# Patient Record
Sex: Female | Born: 1995 | Race: White | Hispanic: No | Marital: Single | Smoking: Never smoker
Health system: Southern US, Community
[De-identification: ages and names within clinical notes are randomized; demographics above are authoritative.]

## PROBLEM LIST (undated history)

## (undated) DIAGNOSIS — L709 Acne, unspecified: Secondary | ICD-10-CM

## (undated) DIAGNOSIS — H9325 Central auditory processing disorder: Secondary | ICD-10-CM

## (undated) DIAGNOSIS — N920 Excessive and frequent menstruation with regular cycle: Secondary | ICD-10-CM

## (undated) DIAGNOSIS — F319 Bipolar disorder, unspecified: Secondary | ICD-10-CM

## (undated) HISTORY — DX: Central auditory processing disorder: H93.25

## (undated) HISTORY — DX: Acne, unspecified: L70.9

## (undated) HISTORY — PX: DENTAL SURGERY: SHX609

## (undated) HISTORY — PX: TYMPANOSTOMY: SHX2586

## (undated) HISTORY — DX: Excessive and frequent menstruation with regular cycle: N92.0

---
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
## 2014-01-09 ENCOUNTER — Other Ambulatory Visit: Payer: Self-pay | Admitting: Nurse Practitioner

## 2014-01-11 NOTE — Telephone Encounter (Signed)
Patients last AEX was 08/26/12 when she was started on OCP, then next appointment was 02/16/13 to follow up on OCP. Left message for patient to schedule AEX//kn

## 2014-01-15 ENCOUNTER — Telehealth: Payer: Self-pay | Admitting: Nurse Practitioner

## 2014-01-15 NOTE — Telephone Encounter (Signed)
Message left to return call to [facility] at [phone].   Patient needs to schedule annual.   Tresa [facility]Kelly placed new order for ocp. Will discuss with patient at return call.

## 2014-01-15 NOTE — Telephone Encounter (Signed)
Patient returned call and aex scheduled.   Routing to provider for final review. Patient agreeable to disposition. Will close encounter

## 2014-01-15 NOTE — Telephone Encounter (Signed)
[facility] is calling [facility].? i do not see a note in here anywhere.

## 2014-01-15 NOTE — Telephone Encounter (Signed)
agree

## 2014-02-23 ENCOUNTER — Encounter: Payer: Self-pay | Admitting: Nurse Practitioner

## 2014-02-23 ENCOUNTER — Ambulatory Visit (INDEPENDENT_AMBULATORY_CARE_PROVIDER_SITE_OTHER): Payer: BC Managed Care – PPO | Admitting: Nurse Practitioner

## 2014-02-23 VITALS — BP 120/82 | HR 80 | Ht 67.5 in | Wt 181.0 lb

## 2014-02-23 DIAGNOSIS — Z0000 Encounter for general adult medical examination without abnormal findings: Secondary | ICD-10-CM

## 2014-02-23 DIAGNOSIS — Z01419 Encounter for gynecological examination (general) (routine) without abnormal findings: Secondary | ICD-10-CM

## 2014-02-23 LAB — POCT URINALYSIS DIPSTICK
BILIRUBIN UA: NEGATIVE
GLUCOSE UA: NEGATIVE
Ketones, UA: NEGATIVE
LEUKOCYTES UA: NEGATIVE
NITRITE UA: NEGATIVE
Protein, UA: NEGATIVE
RBC UA: NEGATIVE
UROBILINOGEN UA: NEGATIVE
pH, UA: 7

## 2014-02-23 LAB — HEMOGLOBIN, FINGERSTICK: HEMOGLOBIN, FINGERSTICK: 14.4 g/dL (ref 12.0–16.0)

## 2014-02-23 MED ORDER — DESOGESTREL-ETHINYL ESTRADIOL 0.15-0.02/0.01 MG (21/5) PO TABS: ORAL_TABLET | ORAL | Status: DC

## 2014-02-23 NOTE — Patient Instructions (Signed)
General topics  Next pap or exam is  due in 1 year Take a Women's multivitamin Take 1200 mg. of calcium daily - prefer dietary If any concerns in interim to call back  Breast Self-Awareness Practicing breast self-awareness may pick up problems early, prevent significant medical complications, and possibly save your life. By practicing breast self-awareness, you can become familiar with how your breasts look and feel and if your breasts are changing. This allows you to notice changes early. It can also offer you some reassurance that your breast health is good. One way to learn what is normal for your breasts and whether your breasts are changing is to do a breast self-exam. If you find a lump or something that was not present in the past, it is best to contact your caregiver right away. Other findings that should be evaluated by your caregiver include nipple discharge, especially if it is bloody; skin changes or reddening; areas where the skin seems to be pulled in (retracted); or new lumps and bumps. Breast pain is seldom associated with cancer (malignancy), but should also be evaluated by a caregiver. BREAST SELF-EXAM The best time to examine your breasts is 5 7 days after your menstrual period is over.  ExitCare Patient Information 2013 ExitCare, LLC.   Exercise to Stay Healthy Exercise helps you become and stay healthy. EXERCISE IDEAS AND TIPS Choose exercises that:  You enjoy.  Fit into your day. You do not need to exercise really hard to be healthy. You can do exercises at a slow or medium level and stay healthy. You can:  Stretch before and after working out.  Try yoga, Pilates, or tai chi.  Lift weights.  Walk fast, swim, jog, run, climb stairs, bicycle, dance, or rollerskate.  Take aerobic classes. Exercises that burn about 150 calories:  Running 1  miles in 15 minutes.  Playing volleyball for 45 to 60 minutes.  Washing and waxing a car for 45 to 60  minutes.  Playing touch football for 45 minutes.  Walking 1  miles in 35 minutes.  Pushing a stroller 1  miles in 30 minutes.  Playing basketball for 30 minutes.  Raking leaves for 30 minutes.  Bicycling 5 miles in 30 minutes.  Walking 2 miles in 30 minutes.  Dancing for 30 minutes.  Shoveling snow for 15 minutes.  Swimming laps for 20 minutes.  Walking up stairs for 15 minutes.  Bicycling 4 miles in 15 minutes.  Gardening for 30 to 45 minutes.  Jumping rope for 15 minutes.  Washing windows or floors for 45 to 60 minutes. Document Released: 11/24/2010 Document Revised: 01/14/2012 Document Reviewed: 11/24/2010 ExitCare Patient Information 2013 ExitCare[employer] LLC.   Other topics ( that may be useful information):    Sexually Transmitted Disease Sexually transmitted disease (STD) refers to any infection that is passed from person to person during sexual activity. This may happen by way of saliva, semen, blood, vaginal mucus, or urine. Common STDs include:  Gonorrhea.  Chlamydia.  Syphilis.  HIV/AIDS.  Genital herpes.  Hepatitis B and C.  Trichomonas.  Human papillomavirus (HPV).  Pubic lice. CAUSES  An STD may be spread by bacteria, virus, or parasite. A person can get an STD by:  Sexual intercourse with an infected person.  Sharing sex toys with an infected person.  Sharing needles with an infected person.  Having intimate contact with the genitals, mouth, or rectal areas of an infected person. SYMPTOMS  Some people may not have any symptoms, but   they can still pass the infection to others. Different STDs have different symptoms. Symptoms include:  Painful or bloody urination.  Pain in the pelvis, abdomen, vagina, anus, throat, or eyes.  Skin rash, itching, irritation, growths, or sores (lesions). These usually occur in the genital or anal area.  Abnormal vaginal discharge.  Penile discharge in men.  Soft, flesh-colored skin growths in the  genital or anal area.  Fever.  Pain or bleeding during sexual intercourse.  Swollen glands in the groin area.  Yellow skin and eyes (jaundice). This is seen with hepatitis. DIAGNOSIS  To make a diagnosis, your caregiver may:  Take a medical history.  Perform a physical exam.  Take a specimen (culture) to be examined.  Examine a sample of discharge under a microscope.  Perform blood test TREATMENT   Chlamydia, gonorrhea, trichomonas, and syphilis can be cured with antibiotic medicine.  Genital herpes, hepatitis, and HIV can be treated, but not cured, with prescribed medicines. The medicines will lessen the symptoms.  Genital warts from HPV can be treated with medicine or by freezing, burning (electrocautery), or surgery. Warts may come back.  HPV is a virus and cannot be cured with medicine or surgery.However, abnormal areas may be followed very closely by your caregiver and may be removed from the cervix, vagina, or vulva through office procedures or surgery. If your diagnosis is confirmed, your recent sexual partners need treatment. This is true even if they are symptom-free or have a negative culture or evaluation. They should not have sex until their caregiver says it is okay. HOME CARE INSTRUCTIONS  All sexual partners should be informed, tested, and treated for all STDs.  Take your antibiotics as directed. Finish them even if you start to feel better.  Only take over-the-counter or prescription medicines for pain, discomfort, or fever as directed by your caregiver.  Rest.  Eat a balanced diet and drink enough fluids to keep your urine clear or pale yellow.  Do not have sex until treatment is completed and you have followed up with your caregiver. STDs should be checked after treatment.  Keep all follow-up appointments, Pap tests, and blood tests as directed by your caregiver.  Only use latex condoms and water-soluble lubricants during sexual activity. Do not use  petroleum jelly or oils.  Avoid alcohol and illegal drugs.  Get vaccinated for HPV and hepatitis. If you have not received these vaccines in the past, talk to your caregiver about whether one or both might be right for you.  Avoid risky sex practices that can break the skin. The only way to avoid getting an STD is to avoid all sexual activity.Latex condoms and dental dams (for oral sex) will help lessen the risk of getting an STD, but will not completely eliminate the risk. SEEK MEDICAL CARE IF:   You have a fever.  You have any new or worsening symptoms. Document Released: 01/12/2003 Document Revised: 01/14/2012 Document Reviewed: 01/19/2011 [facility] Patient Information 2013 [employer].    Domestic Abuse You are being battered or abused if someone close to you hits, pushes, or physically hurts you in any way. You also are being abused if you are forced into activities. You are being sexually abused if you are forced to have sexual contact of any kind. You are being emotionally abused if you are made to feel worthless or if you are constantly threatened. It is important to remember that help is available. No one has the right to abuse you. PREVENTION OF FURTHER  ABUSE  Learn the warning signs of danger. This varies with situations but may include: the use of alcohol, threats, isolation from friends and family, or forced sexual contact. Leave if you feel that violence is going to occur.  If you are attacked or beaten, report it to the police so the abuse is documented. You do not have to press charges. The police can protect you while you or the attackers are leaving. Get the officer's name and badge number and a copy of the report.  Find someone you can trust and tell them what is happening to you: your caregiver, a nurse, clergy member, close friend or family member. Feeling ashamed is natural, but remember that you have done nothing wrong. No one deserves abuse. Document Released:  10/19/2000 Document Revised: 01/14/2012 Document Reviewed: 12/28/2010 ExitCare Patient Information 2013 ExitCare[employer] LLC.    How Much is Too Much Alcohol? Drinking too much alcohol can cause injury, accidents, and health problems. These types of problems can include:   Car crashes.  Falls.  Family fighting (domestic violence).  Drowning.  Fights.  Injuries.  Burns.  Damage to certain organs.  Having a baby with birth defects. ONE DRINK CAN BE TOO MUCH WHEN YOU ARE:  Working.  Pregnant or breastfeeding.  Taking medicines. Ask your doctor.  Driving or planning to drive. If you or someone you know has a drinking problem, get help from a doctor.  Document Released: 08/18/2009 Document Revised: 01/14/2012 Document Reviewed: 08/18/2009 ExitCare Patient Information 2013 ExitCare[employer] LLC.   Smoking Hazards Smoking cigarettes is extremely bad for your health. Tobacco smoke has over 200 known poisons in it. There are over 60 chemicals in tobacco smoke that cause cancer. Some of the chemicals found in cigarette smoke include:   Cyanide.  Benzene.  Formaldehyde.  Methanol (wood alcohol).  Acetylene (fuel used in welding torches).  Ammonia. Cigarette smoke also contains the poisonous gases nitrogen oxide and carbon monoxide.  Cigarette smokers have an increased risk of many serious medical problems and Smoking causes approximately:  90% of all lung cancer deaths in men.  80% of all lung cancer deaths in women.  90% of deaths from chronic obstructive lung disease. Compared with nonsmokers, smoking increases the risk of:  Coronary heart disease by 2 to 4 times.  Stroke by 2 to 4 times.  Men developing lung cancer by 23 times.  Women developing lung cancer by 13 times.  Dying from chronic obstructive lung diseases by 12 times.  . Smoking is the most preventable cause of death and disease in our society.  WHY IS SMOKING ADDICTIVE?  Nicotine is the chemical  agent in tobacco that is capable of causing addiction or dependence.  When you smoke and inhale, nicotine is absorbed rapidly into the bloodstream through your lungs. Nicotine absorbed through the lungs is capable of creating a powerful addiction. Both inhaled and non-inhaled nicotine may be addictive.  Addiction studies of cigarettes and spit tobacco show that addiction to nicotine occurs mainly during the teen years, when young people begin using tobacco products. WHAT ARE THE BENEFITS OF QUITTING?  There are many health benefits to quitting smoking.   Likelihood of developing cancer and heart disease decreases. Health improvements are seen almost immediately.  Blood pressure, pulse rate, and breathing patterns start returning to normal soon after quitting. QUITTING SMOKING   American Lung Association - 1-800-LUNGUSA  American Cancer Society - 1-800-ACS-2345 Document Released: 11/29/2004 Document Revised: 01/14/2012 Document Reviewed: 08/03/2009 ExitCare Patient Information 2013 ExitCare,   LLC.   Stress Management Stress is a state of physical or mental tension that often results from changes in your life or normal routine. Some common causes of stress are:  Death of a loved one.  Injuries or severe illnesses.  Getting fired or changing jobs.  Moving into a new home. Other causes may be:  Sexual problems.  Business or financial losses.  Taking on a large debt.  Regular conflict with someone at home or at work.  Constant tiredness from lack of sleep. It is not just bad things that are stressful. It may be stressful to:  Win the lottery.  Get married.  Buy a new car. The amount of stress that can be easily tolerated varies from person to person. Changes generally cause stress, regardless of the types of change. Too much stress can affect your health. It may lead to physical or emotional problems. Too little stress (boredom) may also become stressful. SUGGESTIONS TO  REDUCE STRESS:  Talk things over with your family and friends. It often is helpful to share your concerns and worries. If you feel your problem is serious, you may want to get help from a professional counselor.  Consider your problems one at a time instead of lumping them all together. Trying to take care of everything at once may seem impossible. List all the things you need to do and then start with the most important one. Set a goal to accomplish 2 or 3 things each day. If you expect to do too many in a single day you will naturally fail, causing you to feel even more stressed.  Do not use alcohol or drugs to relieve stress. Although you may feel better for a short time, they do not remove the problems that caused the stress. They can also be habit forming.  Exercise regularly - at least 3 times per week. Physical exercise can help to relieve that "uptight" feeling and will relax you.  The shortest distance between despair and hope is often a good night's sleep.  Go to bed and get up on time allowing yourself time for appointments without being rushed.  Take a short "time-out" period from any stressful situation that occurs during the day. Close your eyes and take some deep breaths. Starting with the muscles in your face, tense them, hold it for a few seconds, then relax. Repeat this with the muscles in your neck, shoulders, hand, stomach, back and legs.  Take good care of yourself. Eat a balanced diet and get plenty of rest.  Schedule time for having fun. Take a break from your daily routine to relax. HOME CARE INSTRUCTIONS   Call if you feel overwhelmed by your problems and feel you can no longer manage them on your own.  Return immediately if you feel like hurting yourself or someone else. Document Released: 04/17/2001 Document Revised: 01/14/2012 Document Reviewed: 12/08/2007 ExitCare Patient Information 2013 ExitCare, LLC.  

## 2014-02-23 NOTE — Progress Notes (Signed)
Patient ID: Renee GleeSarah E Mcclain, female   DOB: 03/16/1996, 18 y.o.   MRN: [id number] 18 y.o. G0P0 Single Caucasian Fe here for annual exam.  Menses is usually 4 days and heavier for 1 day.  1 day of cramps that are tolerable.  Same partner for 2 years. She is attending [employer] for business degree.   Patient's last menstrual period was 02/10/2014.          Sexually active: yes  The current method of family planning is OCP (estrogen/progesterone).    Exercising: no  The patient does not participate in regular exercise at present. Smoker:  no  Health Maintenance: Pap:  N/A TDaP:  UTD Gardasil:  completed at age 18 Labs:  HB:  Urine:    reports that she has never smoked. She has never used smokeless tobacco. She reports that she does not drink alcohol or use illicit drugs.  Past Medical History  Diagnosis Date  . Asthma     "grew out of it around 18 years of age"  . Acne   . Menorrhagia   . Attention deficit hyperactivity disorder (ADHD)     Past Surgical History  Procedure Laterality Date  . Tympanostomy    . Dental surgery      Current Outpatient Prescriptions  Medication Sig Dispense Refill  . AZURETTE 0.15-0.02/0.01 MG (21/5) tablet TAKE 1 TABLET BY MOUTH ONCE DAILY  84 tablet  0  . UNKNOWN TO PATIENT Antidepressant, unknown name.       No current facility-administered medications for this visit.    Family History  Problem Relation Age of Onset  . Heart disease Maternal Grandfather   . Hypertension Paternal Grandfather   . Cancer Maternal Grandmother     pancreatic    ROS:  Pertinent items are noted in HPI.  Otherwise, a comprehensive ROS was negative.  Exam:   BP 120/82  Pulse 80  Ht 5' 7.5" (1.715 m)  Wt 181 lb (82.101 kg)  BMI 27.91 kg/m2  LMP 02/10/2014 Height: 5' 7.5" (171.5 cm)  Ht Readings from Last 3 Encounters:  02/23/14 5' 7.5" (1.715 m) (90%*, Z = 1.27)  02/16/13 5' 7.75" (1.721 m) (92%*, Z = 1.39)   * Growth percentiles are based on CDC 2-20 Years  data.    General appearance: alert, cooperative and appears stated age Head: Normocephalic, without obvious abnormality, atraumatic Neck: no adenopathy, supple, symmetrical, trachea midline and thyroid normal to inspection and palpation Lungs: clear to auscultation bilaterally Breasts: normal appearance, no masses or tenderness Heart: regular rate and rhythm Abdomen: soft, non-tender; no masses,  no organomegaly Extremities: extremities normal, atraumatic, no cyanosis or edema Skin: Skin color, texture, turgor normal. No rashes or lesions Lymph nodes: Cervical, supraclavicular, and axillary nodes normal. No abnormal inguinal nodes palpated Neurologic: Grossly normal   Pelvic: External genitalia:  no lesions              Urethra:  normal appearing urethra with no masses, tenderness or lesions              Bartholin's and Skene's: normal                 Vagina: normal appearing vagina with normal color and discharge, no lesions              Cervix: anteverted              Pap taken: no Bimanual Exam:  Uterus:  normal size, contour, position, consistency, mobility, non-tender  Adnexa: no mass, fullness, tenderness               Rectovaginal: Confirms               Anus:  normal sphincter tone, no lesions  A:  Well Woman with normal exam  Contraception   P:   Reviewed health and wellness pertinent to exam  Refill on OCP Mircette for a year  She is advised to take OTC NSAID's for the first day of cycle to help with dysmenorrhea.  Declines need for STD's  Counseled on breast self exam, STD prevention, HIV risk factors and prevention, use and side effects of OCP's, adequate intake of calcium and vitamin D, diet and exercise return annually or prn  An After Visit Summary was printed and given to the patient.

## 2014-02-27 NOTE — Progress Notes (Signed)
Encounter reviewed by Dr. Youssouf Shipley Silva.  

## 2014-04-09 ENCOUNTER — Other Ambulatory Visit: Payer: Self-pay | Admitting: Nurse Practitioner

## 2014-04-12 NOTE — Telephone Encounter (Signed)
Last AEX and refill 02/23/14 #3 packs/3 refills No future appt  Rx sent for 28 days with note- Patient will need AEX for further refills.

## 2014-04-21 ENCOUNTER — Encounter (HOSPITAL_COMMUNITY): Payer: Self-pay | Admitting: Emergency Medicine

## 2014-04-21 ENCOUNTER — Emergency Department (HOSPITAL_COMMUNITY)
Admission: EM | Admit: 2014-04-21 | Discharge: 2014-04-21 | Disposition: A | Discharge status: Home / Self Care | Payer: BC Managed Care – PPO | Attending: Emergency Medicine | Admitting: Emergency Medicine

## 2014-04-21 DIAGNOSIS — R45851 Suicidal ideations: Secondary | ICD-10-CM | POA: Insufficient documentation

## 2014-04-21 DIAGNOSIS — Z79899 Other long term (current) drug therapy: Secondary | ICD-10-CM | POA: Insufficient documentation

## 2014-04-21 DIAGNOSIS — F4321 Adjustment disorder with depressed mood: Secondary | ICD-10-CM

## 2014-04-21 DIAGNOSIS — Z872 Personal history of diseases of the skin and subcutaneous tissue: Secondary | ICD-10-CM | POA: Insufficient documentation

## 2014-04-21 DIAGNOSIS — F39 Unspecified mood [affective] disorder: Secondary | ICD-10-CM | POA: Insufficient documentation

## 2014-04-21 DIAGNOSIS — Z8742 Personal history of other diseases of the female genital tract: Secondary | ICD-10-CM | POA: Insufficient documentation

## 2014-04-21 DIAGNOSIS — J45909 Unspecified asthma, uncomplicated: Secondary | ICD-10-CM | POA: Insufficient documentation

## 2014-04-21 LAB — RAPID URINE DRUG SCREEN, HOSP PERFORMED
AMPHETAMINES: NOT DETECTED
BENZODIAZEPINES: NOT DETECTED
Barbiturates: NOT DETECTED
COCAINE: NOT DETECTED
Opiates: NOT DETECTED
Tetrahydrocannabinol: POSITIVE — AB

## 2014-04-21 LAB — CBC
HEMATOCRIT: 40.3 % (ref 36.0–46.0)
Hemoglobin: 13.7 g/dL (ref 12.0–15.0)
MCH: 30.2 pg (ref 26.0–34.0)
MCHC: 34 g/dL (ref 30.0–36.0)
MCV: 88.8 fL (ref 78.0–100.0)
PLATELETS: 291 10*3/uL (ref 150–400)
RBC: 4.54 MIL/uL (ref 3.87–5.11)
RDW: 12.4 % (ref 11.5–15.5)
WBC: 10.4 10*3/uL (ref 4.0–10.5)

## 2014-04-21 LAB — COMPREHENSIVE METABOLIC PANEL
ALBUMIN: 3.9 g/dL (ref 3.5–5.2)
ALK PHOS: 43 U/L (ref 39–117)
ALT: 13 U/L (ref 0–35)
AST: 14 U/L (ref 0–37)
BUN: 6 mg/dL (ref 6–23)
CO2: 20 mEq/L (ref 19–32)
CREATININE: 0.63 mg/dL (ref 0.50–1.10)
Calcium: 9 mg/dL (ref 8.4–10.5)
Chloride: 105 mEq/L (ref 96–112)
GFR calc Af Amer: >90 mL/min (ref >90)
GFR calc non Af Amer: >90 mL/min (ref >90)
Glucose, Bld: 93 mg/dL (ref 70–99)
POTASSIUM: 3.8 meq/L (ref 3.7–5.3)
Sodium: 140 mEq/L (ref 137–147)
TOTAL PROTEIN: 7.4 g/dL (ref 6.0–8.3)
Total Bilirubin: 0.3 mg/dL (ref 0.3–1.2)

## 2014-04-21 LAB — ETHANOL

## 2014-04-21 LAB — SALICYLATE LEVEL

## 2014-04-21 LAB — ACETAMINOPHEN LEVEL

## 2014-04-21 MED ORDER — IBUPROFEN 200 MG PO TABS: 600.0000 mg | ORAL_TABLET | Freq: Three times a day (TID) | ORAL | Status: DC | PRN

## 2014-04-21 MED ORDER — ALUM & MAG HYDROXIDE-SIMETH 200-200-20 MG/5ML PO SUSP: 30.0000 mL | ORAL | Status: DC | PRN

## 2014-04-21 MED ORDER — ZOLPIDEM TARTRATE 5 MG PO TABS: 5.0000 mg | ORAL_TABLET | Freq: Every evening | ORAL | Status: DC | PRN

## 2014-04-21 MED ORDER — LORAZEPAM 1 MG PO TABS: 1.0000 mg | ORAL_TABLET | Freq: Three times a day (TID) | ORAL | Status: DC | PRN

## 2014-04-21 MED ORDER — ACETAMINOPHEN 325 MG PO TABS: 650.0000 mg | ORAL_TABLET | ORAL | Status: DC | PRN

## 2014-04-21 MED ORDER — ONDANSETRON HCL 4 MG PO TABS: 4.0000 mg | ORAL_TABLET | Freq: Three times a day (TID) | ORAL | Status: DC | PRN

## 2014-04-21 NOTE — ED Notes (Signed)
Pt states that she has hx of depression.  Has been seeing counselor but now refuses to see her because she doesn's help.  States that she has had a "breakdown" before when her medicine was adjusted.  Was put on celexa about 2 months ago.  Got a DUI a few weeks ago and is feeling like things are out of control.  BIB mother who is concerned after pt made SI statements.  Pt endorses to this writer that she is suicidal with plan to drive her car into a tree.  States she has been smoking weed and taking xanax.  No HI.

## 2014-04-21 NOTE — BH Assessment (Signed)
Pt psychiatrically stable for discharge per psychiatry, Dr.Taylor and Shuvon Rankin. Pt [facility] at 9:00am and appt. With Renee KernMegan Blankmann,NP on 06-08-14 @1 :30pm. Pt able to contract for safety. Crisis resources and Outpatient appt's scheduled by TTS.  Renee PeachNajah Demetrias Goodbar, MS, LCASA Assessment Counselor

## 2014-04-21 NOTE — Discharge Instructions (Signed)
Adjustment Disorder °Most changes in life can cause stress. Getting used to changes may take a few months or longer. If feelings of stress, hopelessness, or worry continue, you may have an adjustment disorder. This stress-related mental health problem may affect your feelings, thinking and how you act. It occurs in both sexes and happens at any age. °SYMPTOMS  °Some of the following problems may be seen and vary from person to person: °· Sadness or depression. °· Loss of enjoyment. °· Thoughts of suicide. °· Fighting. °· Avoiding family and friends. °· Poor school performance. °· Hopelessness, sense of loss. °· Trouble sleeping. °· Vandalism. °· Worry, weight loss or gain. °· Crying spells. °· Anxiety °· Reckless driving. °· Skipping school. °· Poor work performance. °· Nervousness. °· Ignoring bills. °· Poor attitude. °DIAGNOSIS  °Your caregiver will ask what has happened in your life and do a physical exam. They will make a diagnosis of an adjustment disorder when they are sure another problem or medical illness causing your feelings does not exist. °TREATMENT  °When problems caused by stress interfere with you daily life or last longer than a few months, you may need counseling for an adjustment disorder. Early treatment may diminish problems and help you to better cope with the stressful events in your life. Sometimes medication is necessary. Individual counseling and or support groups can be very helpful. °PROGNOSIS  °Adjustment disorders usually last less than 3 to 6 months. The condition may persist if there is long lasting stress. This could include health problems, relationship problems, or job difficulties where you can not easily escape from what is causing the problem. °PREVENTION  °Even the most mentally healthy, highly functioning people can suffer from an adjustment disorder given a significant blow from a life-changing event. There is no way to prevent pain and loss. Most people need help from time  to time. You are not alone. °SEEK MEDICAL CARE IF:  °Your feelings or symptoms listed above do not improve or worsen. °Document Released: 06/26/2006 Document Revised: 01/14/2012 Document Reviewed: 09/17/2007 °ExitCare® Patient Information ©2015 ExitCare, LLC. This information is not intended to replace advice given to you by your health care provider. Make sure you discuss any questions you have with your health care provider. ° °

## 2014-04-21 NOTE — Consult Note (Signed)
Face to face evaluation and I agree with this note 

## 2014-04-21 NOTE — ED Notes (Signed)
Pt. Has one belonging bag that is at the nurse desk in triage and family will take belongings home when mother leaves.

## 2014-04-21 NOTE — ED Provider Notes (Signed)
Medical screening examination/treatment/procedure(s) were performed by non-physician practitioner and as supervising physician I was immediately available for consultation/collaboration.   EKG Interpretation None        Forrest S Harrison, MD 04/21/14 1613 

## 2014-04-21 NOTE — Consult Note (Signed)
[facility] Consult   Reason for Consult:  Suicidal ideation Referring Physician:  EDP  Renee Mcclain is an 18 y.o. female. Total Time spent with patient: 45 minutes  Assessment: AXIS I:  Adjustment Disorder with Depressed Mood AXIS II:  Deferred AXIS III:   Past Medical History  Diagnosis Date  . Asthma     "grew out of it around 18 years of age"  . Acne   . Menorrhagia   . Attention deficit hyperactivity disorder (ADHD)    AXIS IV:  other psychosocial or environmental problems AXIS V:  61-70 mild symptoms  Plan:  No evidence of imminent risk to self or others at present.   Patient does not meet criteria for psychiatric inpatient admission. Supportive therapy provided about ongoing stressors. Discussed crisis plan, support from social network, calling 911, coming to the Emergency Department, and calling Suicide Hotline.  Subjective:   Renee Mcclain is a 18 y.o. female patient.  HPI:  Patient states "I was upset this morning I have had a bad day and a bad week.  I just had a complete melt down and said some things that I shouldn't have said and didn't mean.  I just feel like I keep letting everyone down and I got a DUI and then me and my boyfriend got into a argument and I just felt like I let him down to.  I went to my parents office and told them I would run my car off of the road.  My Dad brought me here.   HPI Elements:   Location:  Depression. Quality:  recent DUI and argument with boyfriend. Severity:  adjustment disorder. Timing:  1 week.  Past Psychiatric History: Past Medical History  Diagnosis Date  . Asthma     "grew out of it around 18 years of age"  . Acne   . Menorrhagia   . Attention deficit hyperactivity disorder (ADHD)     reports that she has never smoked. She has never used smokeless tobacco. She reports that she uses illicit drugs (Marijuana). She reports that she does not drink alcohol. Family History  Problem Relation Age of  Onset  . Heart disease Maternal Grandfather   . Hypertension Paternal Grandfather   . Cancer Maternal Grandmother     pancreatic           Allergies:   Allergies  Allergen Reactions  . Azithromycin Hives    ACT Assessment Complete:  Yes:    Educational Status    Risk to Self: Risk to self Is patient at risk for suicide?: Yes Substance abuse history and/or treatment for substance abuse?: Yes  Risk to Others:    Abuse:    Prior Inpatient Therapy:    Prior Outpatient Therapy:    Additional Information:                    Objective: Blood pressure 154/81, pulse 90, temperature 97.7 F (36.5 C), temperature source Oral, resp. rate 16, last menstrual period 04/14/2014, SpO2 98.00%.There is no weight on file to calculate BMI. Results for orders placed during the hospital encounter of 04/21/14 (from the past 72 hour(s))  URINE RAPID DRUG SCREEN (HOSP PERFORMED)     Status: Abnormal   Collection Time    04/21/14 12:48 PM      Result Value Ref Range   Opiates NONE DETECTED  NONE DETECTED   Cocaine NONE DETECTED  NONE DETECTED   Benzodiazepines NONE DETECTED  NONE  DETECTED   Amphetamines NONE DETECTED  NONE DETECTED   Tetrahydrocannabinol POSITIVE (*) NONE DETECTED   Barbiturates NONE DETECTED  NONE DETECTED   Comment:            DRUG SCREEN FOR MEDICAL PURPOSES     ONLY.  IF CONFIRMATION IS NEEDED     FOR ANY PURPOSE, NOTIFY LAB     WITHIN 5 DAYS.                LOWEST DETECTABLE LIMITS     FOR URINE DRUG SCREEN     Drug Class       Cutoff (ng/mL)     Amphetamine      1000     Barbiturate      200     Benzodiazepine   354     Tricyclics       562     Opiates          300     Cocaine          300     THC              50  ACETAMINOPHEN LEVEL     Status: None   Collection Time    04/21/14  1:41 PM      Result Value Ref Range   Acetaminophen (Tylenol), Serum <15.0  10 - 30 ug/mL   Comment:            THERAPEUTIC CONCENTRATIONS VARY     SIGNIFICANTLY. A  RANGE OF 10-30     ug/mL MAY BE AN EFFECTIVE     CONCENTRATION FOR MANY PATIENTS.     HOWEVER, SOME ARE BEST TREATED     AT CONCENTRATIONS OUTSIDE THIS     RANGE.     ACETAMINOPHEN CONCENTRATIONS     >150 ug/mL AT 4 HOURS AFTER     INGESTION AND >50 ug/mL AT 12     HOURS AFTER INGESTION ARE     OFTEN ASSOCIATED WITH TOXIC     REACTIONS.  CBC     Status: None   Collection Time    04/21/14  1:41 PM      Result Value Ref Range   WBC 10.4  4.0 - 10.5 K/uL   RBC 4.54  3.87 - 5.11 MIL/uL   Hemoglobin 13.7  12.0 - 15.0 g/dL   HCT 40.3  36.0 - 46.0 %   MCV 88.8  78.0 - 100.0 fL   MCH 30.2  26.0 - 34.0 pg   MCHC 34.0  30.0 - 36.0 g/dL   RDW 12.4  11.5 - 15.5 %   Platelets 291  150 - 400 K/uL  COMPREHENSIVE METABOLIC PANEL     Status: None   Collection Time    04/21/14  1:41 PM      Result Value Ref Range   Sodium 140  137 - 147 mEq/L   Potassium 3.8  3.7 - 5.3 mEq/L   Chloride 105  96 - 112 mEq/L   CO2 20  19 - 32 mEq/L   Glucose, Bld 93  70 - 99 mg/dL   BUN 6  6 - 23 mg/dL   Creatinine, Ser 0.63  0.50 - 1.10 mg/dL   Calcium 9.0  8.4 - 10.5 mg/dL   Total Protein 7.4  6.0 - 8.3 g/dL   Albumin 3.9  3.5 - 5.2 g/dL   AST 14  0 - 37 U/L   ALT 13  0 -  35 U/L   Alkaline Phosphatase 43  39 - 117 U/L   Total Bilirubin 0.3  0.3 - 1.2 mg/dL   GFR calc non Af Amer >90  >90 mL/min   GFR calc Af Amer >90  >90 mL/min   Comment: (NOTE)     The eGFR has been calculated using the CKD EPI equation.     This calculation has not been validated in all clinical situations.     eGFR's persistently <90 mL/min signify possible Chronic Kidney     Disease.  ETHANOL     Status: None   Collection Time    04/21/14  1:41 PM      Result Value Ref Range   Alcohol, Ethyl (B) <11  0 - 11 mg/dL   Comment:            LOWEST DETECTABLE LIMIT FOR     SERUM ALCOHOL IS 11 mg/dL     FOR MEDICAL PURPOSES ONLY  SALICYLATE LEVEL     Status: Abnormal   Collection Time    04/21/14  1:41 PM      Result Value Ref  Range   Salicylate Lvl <7.2 (*) 2.8 - 20.0 mg/dL   Labs are reviewed see above.  Medication reviewed and no changes made.   Current Facility-Administered Medications  Medication Dose Route Frequency Ezekiel Menzer Last Rate Last Dose  . acetaminophen (TYLENOL) tablet 650 mg  650 mg Oral Q4H PRN Alvina Chou, PA-C      . alum & mag hydroxide-simeth (MAALOX/MYLANTA) 200-200-20 MG/5ML suspension 30 mL  30 mL Oral PRN Alvina Chou, PA-C      . ibuprofen (ADVIL,MOTRIN) tablet 600 mg  600 mg Oral Q8H PRN Alvina Chou, PA-C      . LORazepam (ATIVAN) tablet 1 mg  1 mg Oral Q8H PRN Kaitlyn Szekalski, PA-C      . ondansetron (ZOFRAN) tablet 4 mg  4 mg Oral Q8H PRN Kaitlyn Szekalski, PA-C      . zolpidem (AMBIEN) tablet 5 mg  5 mg Oral QHS PRN Alvina Chou, PA-C       Current Outpatient Prescriptions  Medication Sig Dispense Refill  . citalopram (CELEXA) 10 MG tablet Take 10 mg by mouth every morning.       . desogestrel-ethinyl estradiol (AZURETTE) 0.15-0.02/0.01 MG (21/5) tablet Take 1 tablet by mouth every morning.        Psychiatric Specialty Exam:     Blood pressure 154/81, pulse 90, temperature 97.7 F (36.5 C), temperature source Oral, resp. rate 16, last menstrual period 04/14/2014, SpO2 98.00%.There is no weight on file to calculate BMI.  General Appearance: Casual and Fairly Groomed  Eye Contact::  Good  Speech:  Clear and Coherent and Normal Rate  Volume:  Normal  Mood:  Anxious  Affect:  Congruent  Thought Process:  Circumstantial, Coherent, Goal Directed and Logical  Orientation:  Full (Time, Place, and Person)  Thought Content:  Rumination  Suicidal Thoughts:  No  Homicidal Thoughts:  No  Memory:  Immediate;   Good Recent;   Good Remote;   Good  Judgement:  Intact  Insight:  Present  Psychomotor Activity:  Normal  Concentration:  Good  Recall:  Good  Fund of Knowledge:Good  Language: Good  Akathisia:  No  Handed:  Right  AIMS (if indicated):      Assets:  Communication Skills Desire for [place] Support Transportation  Sleep:      Musculoskeletal: Strength & Muscle Tone: within  normal limits Gait & Station: normal Patient leans: N/A  Treatment Plan Summary: Discharge home with appointment for outpatient therapy and medication management TTS will set up appointment with [facility] Outpatient services  Earleen Newport, FNP-BC 04/21/2014 2:51 PM

## 2014-04-21 NOTE — ED Provider Notes (Signed)
CSN: [id number]     Arrival date & time 04/21/14  1155 History   First MD Initiated Contact with Patient 04/21/14 1319     Chief Complaint  Patient presents with  . Medical Clearance     (Consider location/radiation/quality/duration/timing/severity/associated sxs/prior Treatment) HPI Comments: Patient is a 18 year old female with a past medical history of depression who presents with her parents after expressing suicidal feeling today. Patient reports wanting to "drive her car into a tree" rather than live. She had a recent DUI and has been self-medicating with alcohol, Xanax, and marijuana. Patient currently takes Celexa for depression. She has not used drugs or alcohol in the past few days. Patient's parents are concerned. No HI.    Past Medical History  Diagnosis Date  . Asthma     "grew out of it around 18 years of age"  . Acne   . Menorrhagia   . Attention deficit hyperactivity disorder (ADHD)    Past Surgical History  Procedure Laterality Date  . Tympanostomy    . Dental surgery     Family History  Problem Relation Age of Onset  . Heart disease Maternal Grandfather   . Hypertension Paternal Grandfather   . Cancer Maternal Grandmother     pancreatic   History  Substance Use Topics  . Smoking status: Never Smoker   . Smokeless tobacco: Never Used  . Alcohol Use: No   OB History   Grav Para Term Preterm Abortions TAB SAB Ect Mult Living   0              Review of Systems  Constitutional: Negative for fever, chills and fatigue.  HENT: Negative for trouble swallowing.   Eyes: Negative for visual disturbance.  Respiratory: Negative for shortness of breath.   Cardiovascular: Negative for chest pain and palpitations.  Gastrointestinal: Negative for nausea, vomiting, abdominal pain and diarrhea.  Genitourinary: Negative for dysuria and difficulty urinating.  Musculoskeletal: Negative for arthralgias and neck pain.  Skin: Negative for color change.  Neurological:  Negative for dizziness and weakness.  Psychiatric/Behavioral: Positive for suicidal ideas and dysphoric mood.      Allergies  Azithromycin  Home Medications   Prior to Admission medications   Medication Sig Start Date End Date Taking? Authorizing Provider  citalopram (CELEXA) 10 MG tablet Take 10 mg by mouth every morning.    Yes Historical Provider, MD  desogestrel-ethinyl estradiol (AZURETTE) 0.15-0.02/0.01 MG (21/5) tablet Take 1 tablet by mouth every morning.   Yes Historical Provider, MD   BP 154/81  Pulse 90  Temp(Src) 97.7 F (36.5 C) (Oral)  Resp 16  SpO2 98%  LMP 04/14/2014 Physical Exam  Nursing note and vitals reviewed. Constitutional: She is oriented to person, place, and time. She appears well-developed and well-nourished. No distress.  HENT:  Head: Normocephalic and atraumatic.  Eyes: Conjunctivae and EOM are normal.  Neck: Normal range of motion.  Cardiovascular: Normal rate and regular rhythm.  Exam reveals no gallop and no friction rub.   No murmur heard. Pulmonary/Chest: Effort normal and breath sounds normal. She has no wheezes. She has no rales. She exhibits no tenderness.  Abdominal: Soft. There is no tenderness.  Musculoskeletal: Normal range of motion.  Neurological: She is alert and oriented to person, place, and time. Coordination normal.  Speech is goal-oriented. Moves limbs without ataxia.   Skin: Skin is warm and dry.  Psychiatric:  Dysphoric mood. Patient is tearful.     ED Course  Procedures (including critical  care time) Labs Review Labs Reviewed  SALICYLATE LEVEL - Abnormal; Notable for the following:    Salicylate Lvl <2.0 (*)    All other components within normal limits  URINE RAPID DRUG SCREEN (HOSP PERFORMED) - Abnormal; Notable for the following:    Tetrahydrocannabinol POSITIVE (*)    All other components within normal limits  ACETAMINOPHEN LEVEL  CBC  COMPREHENSIVE METABOLIC PANEL  ETHANOL    Imaging Review No results  found.   EKG Interpretation None      MDM   Final diagnoses:  Adjustment disorder with depressed mood    1:33 PM Patient will be moved to the Psych ED for further evaluation.   Emilia BeckKaitlyn Szekalski, PA-C 04/21/14 1551

## 2014-04-21 NOTE — [facility] Suicide Risk Assessment (Cosign Needed)
Suicide Risk Assessment  Discharge Assessment     Demographic Factors:  Caucasian and Female  Total Time spent with patient: 15 minutes  Psychiatric Specialty Exam:      Blood pressure 154/81, pulse 90, temperature 97.7 F (36.5 C), temperature source Oral, resp. rate 16, last menstrual period 04/14/2014, SpO2 98.00%.There is no weight on file to calculate BMI.   General Appearance: Casual and Fairly Groomed   Eye Contact:: Good   Speech: Clear and Coherent and Normal Rate   Volume: Normal   Mood: Anxious   Affect: Congruent   Thought Process: Circumstantial, Coherent, Goal Directed and Logical   Orientation: Full (Time, Place, and Person)   Thought Content: Rumination   Suicidal Thoughts: No   Homicidal Thoughts: No   Memory: Immediate; Good  Recent; Good  Remote; Good   Judgement: Intact   Insight: Present   Psychomotor Activity: Normal   Concentration: Good   Recall: Good   Fund of Knowledge:Good   Language: Good   Akathisia: No   Handed: Right   AIMS (if indicated):   Assets: Communication Skills  Desire for Improvement  Housing  Physical Health  Social Support  Transportation   Sleep:   Musculoskeletal:  Strength & Muscle Tone: within normal limits  Gait & Station: normal  Patient leans: N/A    Mental Status Per Nursing Assessment::   On Admission:     Current Mental Status by Physician: Patient denies suicidal/homicidal ideation, psychosis and paranoia  Loss Factors: NA  Historical Factors: NA  Risk Reduction Factors:   Living with another person, especially a relative and Positive social support  Continued Clinical Symptoms:  Depression  Cognitive Features That Contribute To Risk:  None noted    Suicide Risk:  Minimal: No identifiable suicidal ideation.  Patients presenting with no risk factors but with morbid ruminations; may be classified as minimal risk based on the severity of the depressive symptoms  Discharge Diagnoses:    Assessment:  AXIS I: Adjustment Disorder with Depressed Mood  AXIS II: Deferred  AXIS III:  Past Medical History   Diagnosis  Date   .  Asthma      "grew out of it around 18 years of age"   .  Acne    .  Menorrhagia    .  Attention deficit hyperactivity disorder (ADHD)     AXIS IV: other psychosocial or environmental problems  AXIS V: 61-70 mild symptoms  Plan Of Care/Follow-up recommendations:  Activity:  Resume usual activiy Diet:  Resume usual diet  Is patient on multiple antipsychotic therapies at discharge:  No   Has Patient had three or more failed trials of antipsychotic monotherapy by history:  No  Recommended Plan for Multiple Antipsychotic Therapies: NA    Rankin, Shuvon, FNP-BC 04/21/2014, 2:59 PM

## 2014-05-05 ENCOUNTER — Telehealth: Payer: Self-pay | Admitting: Nurse Practitioner

## 2014-05-05 NOTE — Telephone Encounter (Signed)
Spoke with patient. Patient states that she has been taking her birth control at the same time every day and has not missed any pills. "I am still having two cycles every month and have been on this one for two weeks. I was having this same problem when I came in for my annual and I am not taking my birth control wrong." Patient was seen for annual with Renee FranklinPatricia Rolen-Grubb, Renee Mcclain on 02/23/2014. Patient is currently taking Mircette. Patients tone is frustrated/angry. Advised would send a message to Renee FranklinPatricia Rolen-Grubb, Renee Mcclain and covering provider as Alexia Freestoneatty is out of the office today and would give patient a call back with further instructions and recommendations. Patient agreeable.  Routing to Renee Mcclain CNM as covering CC: Renee FranklinPatricia Rolen-Grubb, Renee Mcclain

## 2014-05-05 NOTE — Telephone Encounter (Signed)
Patient is having some issues with her birth control.

## 2014-05-06 MED ORDER — NORETHIN ACE-ETH ESTRAD-FE 1-20 MG-MCG PO TABS: 1.0000 | ORAL_TABLET | Freq: Every day | ORAL | Status: DC

## 2014-05-06 NOTE — Telephone Encounter (Signed)
Looking at the notes she has been on Mircette for awhile and did OK. Now with this BTB we will change OCP to Loestrin Fe 1/20 fir 3 months then call back with a progress report.  Also ask if their has been a change of partner - as BTB may also be related to STD's

## 2014-05-06 NOTE — Telephone Encounter (Signed)
Spoke with patient. Patient has not had a partner change. Advised of message as seen below from Lauro FranklinPatricia Rolen-Grubb, FNP. Patient is agreeable. Rx sent for Loestrin Fe 1/20 to pharmacy of choice.  Routing to provider for final review. Patient agreeable to disposition. Will close encounter

## 2014-06-08 ENCOUNTER — Encounter (INDEPENDENT_AMBULATORY_CARE_PROVIDER_SITE_OTHER): Payer: Self-pay

## 2014-06-08 ENCOUNTER — Encounter (HOSPITAL_COMMUNITY): Payer: Self-pay | Admitting: Psychiatry

## 2014-06-08 ENCOUNTER — Ambulatory Visit (INDEPENDENT_AMBULATORY_CARE_PROVIDER_SITE_OTHER): Payer: BC Managed Care – PPO | Admitting: Psychiatry

## 2014-06-08 VITALS — BP 120/78 | HR 76 | Ht 68.0 in | Wt 177.0 lb

## 2014-06-08 DIAGNOSIS — F411 Generalized anxiety disorder: Secondary | ICD-10-CM

## 2014-06-08 DIAGNOSIS — H9325 Central auditory processing disorder: Secondary | ICD-10-CM

## 2014-06-08 DIAGNOSIS — F39 Unspecified mood [affective] disorder: Secondary | ICD-10-CM

## 2014-06-08 MED ORDER — ARIPIPRAZOLE 5 MG PO TABS: 5.0000 mg | ORAL_TABLET | Freq: Every day | ORAL | Status: DC

## 2014-06-08 MED ORDER — HYDROXYZINE HCL 25 MG PO TABS: 25.0000 mg | ORAL_TABLET | Freq: Three times a day (TID) | ORAL | Status: AC | PRN

## 2014-06-08 NOTE — Progress Notes (Signed)
Psychiatric Assessment Child/Adolescent  Patient Identification:  Renee Mcclain Date of Evaluation:  06/08/2014 Chief Complaint:   History of Chief Complaint:  No chief complaint on file.   HPI Pt is here with mother. Pt has been given several diagnoses in the past, ied ADHD, Adjustment disorder, with mixed mood disturbance. Pt sleeps 2-4 hours; appetite is poor. Mood is irritable, mood swings for the past year. She has low distress tolerance. Pt also has anxiety, depression, appetite changes, poor sleep, racing thoughts, anhedonia, morbid thoughts, e.g.recently, wanted to driver her mother's car into a tree, low energy, panic attacks,s, obsessive thoughts, poor concentration, hyperactivity. Mom reports that she had a paradoxical affect with citalopram, and had more anxiety, panic attacks, was placed on quetiapine 50 mg po hs. Pt is concerned with weight gain. She was also tried on Adderall for ADHD, and became more manic on it, the doctors rescinded diagnosis. Family History of Grandmother with Bipolar, Uncle and cousin with affective disorders. She will start to see Leanne this month; encouraged to keep a feelings journal. Pt is very frustrated. She doesn't want to take medication, but then doesn't like her volatile moods. She seems to be more emotional around menses, and was placed on birth control, which hasn't helped. Current stressors are quitting her job, and family issues. She has incurred legal issues, ie a DUI. She is currently a Film/video editor. She has a substance abuse history of using, cannabis, alprazolam, and alcohol. Pt is reluctant to share, and became tearful during the interview. Pt doesn't want to have to change medications, but can't tolerate the few meds she has tried. Currently, she denies SI/HI/AVH. Pt is very labile, during interview, fairly cooperative.  Review of Systems Physical Exam   Mood Symptoms:  Anhedonia, Appetite, Concentration, Energy, Mood Swings, Psychomotor  Retardation, Worthlessness,  (Hypo) Manic Symptoms: Elevated Mood:  No Irritable Mood:  Yes Grandiosity:  No Distractibility:  Yes Labiality of Mood:  Yes Delusions:  No Hallucinations:  No Impulsivity:  Yes Sexually Inappropriate Behavior:  No Financial Extravagance:  Yes Flight of Ideas:  No  Anxiety Symptoms: Excessive Worry:  No Panic Symptoms:  No Agoraphobia:  No Obsessive Compulsive: Yes  Symptoms: None, Specific Phobias:  No Social Anxiety:  No  Psychotic Symptoms:  Hallucinations: No None Delusions:  No Paranoia:  No   Ideas of Reference:  No  PTSD Symptoms: Ever had a traumatic exposure:  No Had a traumatic exposure in the last month:  No Re-experiencing: No None Hypervigilance:  No Hyperarousal: No None Avoidance: No None  Traumatic Brain Injury: No  Past Psychiatric History: Diagnosis:  Episodic Mood Disorder, anxiety, unspecified.   Hospitalizations:  none  Outpatient Care:  Yes   Substance Abuse Care:    Self-Mutilation:   no  Suicidal Attempts:  no  Violent Behaviors:  Verbal eruptions during her period, on birth control   Past Medical History:   Past Medical History  Diagnosis Date  . Asthma     "grew out of it around 18 years of age"  . Acne   . Menorrhagia   . Attention deficit hyperactivity disorder (ADHD)    History of Loss of Consciousness:  No Seizure History:  No Cardiac History:  No Allergies:   Allergies  Allergen Reactions  . Azithromycin Hives   Current Medications:  Current Outpatient Prescriptions  Medication Sig Dispense Refill  . desogestrel-ethinyl estradiol (AZURETTE) 0.15-0.02/0.01 MG (21/5) tablet Take 1 tablet by mouth every morning.      Marland Kitchen  norethindrone-ethinyl estradiol (JUNEL FE,GILDESS FE,LOESTRIN FE) 1-20 MG-MCG tablet Take 1 tablet by mouth daily.  3 Package  0   No current facility-administered medications for this visit.    Previous Psychotropic Medications:  Medication Dose   quetiapine   50 mg  po hs                      Substance Abuse History in the last 12 months:   Substance Age of 1st Use Last Use Amount Specific Type  Nicotine      Alcohol  15  x 2 weeks    5 beers   occasional   Cannabis  17  x 1mos ago  1 bowl  daily user  Opiates      Cocaine      Methamphetamines      LSD      Ecstasy      Benzodiazepines      Caffeine      Inhalants      Others:                         Medical Consequences of Substance Abuse: NA  Legal Consequences of Substance Abuse: speeding tickets  Family Consequences of Substance Abuse: parents chastised her   Blackouts:  No DT's:  No Withdrawal Symptoms: No None  Social History: Current Place of Residence: [place] Place of Birth:  12/25/1995 Family Members: mom and dad, 19 year old brother Children: na  Sons:   Daughters:  Relationships: yes, x 9 mos   Developmental History: 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 Hobbies/Interests: working out   Family History:   Family History  Problem Relation Age of Onset  . Heart disease Maternal Grandfather   . Hypertension Paternal Grandfather   . Cancer Maternal Grandmother     pancreatic    Mental Status Examination/Evaluation: Objective:  Appearance: Casual and Fairly Groomed  Patent attorneyye Contact::  Fair  Speech:  Pressured  Volume:  Increased  Mood:  Irritable, anxious  Affect:  Constricted and Inappropriate  Thought Process:  Circumstantial  Orientation:  Full (Time, Place, and Person)  Thought Content:  Rumination  Suicidal Thoughts:  No  Homicidal Thoughts:  No  Judgement:  Impaired  Insight:  Lacking  Psychomotor Activity:  Restlessness   Akathisia:  No  Handed:  Right  AIMS (if indicated):  AIMS: Facial and Oral Movements Muscles of Facial Expression: None, normal Lips and Perioral Area: None, normal Jaw: None, normal Tongue: None, normal,Extremity Movements Upper (arms, wrists, hands, fingers): None, normal Lower (legs, knees, ankles, toes): None, normal, Trunk Movements Neck, shoulders, hips: None, normal, Overall Severity Severity of abnormal movements (highest score from questions above): None, normal Incapacitation due to abnormal movements: None, normal Patient's awareness of abnormal movements (rate only patient's report): No Awareness, Dental Status Current problems with teeth and/or dentures?: No Does patient usually wear dentures?: No  Assets:  Leisure Time Physical Health Resilience Talents/Skills    Laboratory/X-Ray Psychological Evaluation(s)   NA  Dr. Marius DitchKumar/Rosena Bartle, NP   Assessment:  Axis I: Anxiety Disorder NOS and Mood  Disorder NOS  AXIS I Anxiety Disorder NOS and Mood Disorder NOS  AXIS II Deferred  AXIS III Past Medical History  Diagnosis Date  . Asthma     "grew out of it around 18 years of age"  . Acne   . Menorrhagia   . Attention deficit hyperactivity disorder (ADHD)     AXIS IV economic problems, educational problems, housing problems, occupational problems, other psychosocial or environmental problems, problems related to legal system/crime, problems related to social environment, problems with access to health care services and problems with primary support group  AXIS V 41-50 serious symptoms   Treatment Plan/Recommendations: Pt is here with mother. Pt has been given several diagnoses in the past, ied ADHD, Adjustment disorder, with mixed mood disturbance. Pt sleeps 2-4 hours; appetite is poor. Mood is irritable, mood swings for the past year. She has low distress tolerance. Pt also has anxiety, depression, appetite changes, poor sleep, racing thoughts, anhedonia, morbid  thoughts, e.g.recently, wanted to driver her mother's car into a tree, low energy, panic attacks,s, obsessive thoughts, poor concentration, hyperactivity. Mom reports that she had a paradoxical affect with citalopram, and had more anxiety, panic attacks, was placed on quetiapine 50 mg po hs. Pt is concerned with weight gain. She was also tried on Adderall for ADHD, and became more manic on it, the doctors rescinded diagnosis. Family History of Grandmother with Bipolar, Uncle and cousin with affective disorders. She will start to see Leanne this month; encouraged to keep a feelings journal. Pt is very frustrated. She doesn't want to take medication, but then doesn't like her volatile moods. She seems to be more emotional around menses, and was placed on birth control, which hasn't helped. Current stressors are quitting her job, and family issues. She has incurred legal issues, ie a DUI. She is currently a Film/video editor. She has a substance abuse history of using, cannabis, alprazolam, and alcohol. Pt is reluctant to share, and became tearful during the interview. Pt doesn't want to have to change medications, but can't tolerate the few meds she has tried. Currently, she denies SI/HI/AVH. Pt is very labile, during interview, fairly cooperative. Will trial aripiprazole 5 mg daily for mood swings, hydroxyzine 25 mg tid prn anxiety/sleep. Discussed RRBO of medications. Rtc in 4 weeks.  Plan of Care: medications, and therapy  Laboratory:  NA  Psychotherapy:  Yes   Medications:  Abilify 5 mg po for mood, hydroxyzine 25 mg tid prn anxiety  Routine PRN Medications:  Yes  Consultations:  As needed   Safety Concerns:  no  Other:      Kendrick Fries, NP 8/4/20151:36 PM

## 2014-06-15 ENCOUNTER — Encounter (HOSPITAL_COMMUNITY): Payer: Self-pay | Admitting: Psychology

## 2014-06-15 ENCOUNTER — Ambulatory Visit (INDEPENDENT_AMBULATORY_CARE_PROVIDER_SITE_OTHER): Payer: BC Managed Care – PPO | Admitting: Psychology

## 2014-06-15 DIAGNOSIS — F39 Unspecified mood [affective] disorder: Secondary | ICD-10-CM

## 2014-06-15 NOTE — Progress Notes (Signed)
Renee Mcclain is a 18 y.o. female patient who is referred by ER for establishing counseling services.  Patient:   Renee Mcclain   DOB:   10/03/1996  MR Number:  [id number]  Location:  [facility] OUTPATIENT THERAPY [place] [id number] [place] [address] Dept: [phone]           Date of Service:   06/15/14  Start Time:   8.45am End Time:   9.45am  Provider/Observer:  Forde Radon [facility]       Billing Code/Service: [phone]  Chief Complaint:     Chief Complaint  Patient presents with  . Mood instability    Reason for Service:  Pt reports of hx of mood instability that has increased in intensity this past year.  Pt reported that mood instability has been since around 7th grade when puberty started.  Pt reported that August last year she left home to attend [facility] and returned home after a week b/c wasn't ready to be so far away from home.  Pt admitted that she wasn't ready prior to going and didn't want to go but wasn't admitting at the time.  Pt reported that her grandmother was also sick w/ pancreatic cancer and did die Oct 2014 which was another stressors.  Pt reported she started tx w/ medication management and counseling at [facility] and was started on antidepressants but had effect of emotional break down. Pt reports in June she had thoughts of driving off the road and was seen in the ER and referred to [facility] at that time.  Mom reported that for years pt has been struggling lack of respect for authority, lying and emotional instability.  Mom reports she has been through several providers and dx w/ ADHD, Auditory Processing D/O, Depression w/ how good response.  Mom reports this is parents last effort in support pt living in their home and currently strained as pt was caught in more lies yesterday.  Current Status:  Pt reports that she is easily irritable, feels down at times and has  experienced "emotional break downs" that she feels is in response to medication effects.  Pt reports that she has struggled w/ sleep this past year going to bed at 4am due to racing thoughts and then waking at 9am and able to maintain for couple days and then sleeping 17 hours straight.  Pt denies any other SI other than day presented in the ER.  Pt denies any self harm.  Pt reports marijuana use starting for the past year- but not since June 2015 when received DUI.  Pt denies panic attacks.  Pt reports feels uncomfortable when in large crowds and nervous meeting new people and will avoid both.  Pt express major stressor is relationship w/ parents and feeling that she is a disappointment and parents comments implying this.  Pt reports this is why she lies to parents to avoid hearing their disappointment.   Reliability of Information: Pt provided majority of information being seen for 50 minutes individually.  Pt was seen w/ parent for about 10 minutes.   Behavioral Observation: Renee Mcclain  presents as a 18 y.o.-year-old Caucasian Female who appeared her stated age. her dress was Appropriate and she was Well Groomed and her manners were Appropriate to the situation.  There were not any physical disabilities noted.  she displayed an appropriate level of cooperation and motivation.    Interactions:    Active  Attention:   within normal limits  Memory:   within normal limits  Visuo-spatial:   not examined  Speech (Volume):  normal  Speech:   normal pitch and normal volume  Thought Process:  Coherent and Relevant  Though Content:  WNL  Orientation:   person, place, time/date and situation  Judgment:   Fair  Planning:   Fair  Affect:    Labile- irritable when mom in session, tearful following.   Mood:    Anxious, Depressed and Irritable  Insight:   Fair  Intelligence:   normal  Marital Status/Living: Pt lives w/ mom, dad, 17y/o brother and family dog.  Pt returned from [employer] after 1 week of attending August 2014.  Pt reports relationship w/ parents strained as they tend to argue.  Mom reports this is from pattern of dishonesty and lying.  Pt reports this is from feeling she is disappointment to parents and mistakes and mental disorder being "thrown up" at her.  Pt reports she is currently in a relationship w/ boyfriend stating they dated for 9 months and past 2 months still seeing each other as often to give time to work through problems pt is coping with.   Strengths:   Pt has employment.  Pt reports compliance with medications.    Current Employment: Pt reports she has obtained PT employment w/ [employer] and will begin next week.    Past Employment:  Pt worked for [employer] for 3 years.  Pt was working around 35 hours a week.  Pt quit June 2015 after visit to ER.  Pt reports parents wanted her to quit as felt bad influence.  Mom reported there was a lot of drug use amongst staff.  Substance Use:  There is a documented history of marijuana abuse confirmed by the patient.  Pt reported that for about 1 year she used marijuana- first use age 17y/o.  She beginning around Jan 2015 using almost daily one blunt.  Pt reports she hasn't used for about the past month since presented at ER.  Pt reports she has passed parent drug testing and employment testing.  Pt reports she consumes about 1time a month (3 to 4 beers).  Pt reports using a couple of times Xanax when 18 years old and Hydrocodone when 18 years old- both disliking way made feel "stupid".   Pt has DUI from June 2015- speeding while under influence of Marijuana.  Pt reports court has been continued till Sept 2015.  Pt reports 2 previous speeding tickets this past year and currently completing community service for one of them.    Education:   HS Graduate Pt has completed some college courses at [employer] spring 2015 and summer 2015.  Pt reports she is scheduled for 3 online classes at [employer] Fall 2015 semester.  Pt  admits not motivated for accept parents require to support her residing in their home.  Pt reports school was always easy to her until high school.  Pt reports then lost motivation for school and struggled more to make Cs and Ds when was an B student previously.    Medical History:   Past Medical History  Diagnosis Date  . Asthma     "grew out of it around 19 years of age"  . Acne   . Menorrhagia   . Attention deficit hyperactivity disorder (ADHD)         Outpatient Encounter Prescriptions as of 06/15/2014  Medication Sig  . ARIPiprazole (ABILIFY)  5 MG tablet Take 1 tablet (5 mg total) by mouth daily.  . hydrOXYzine (ATARAX/VISTARIL) 25 MG tablet Take 1 tablet (25 mg total) by mouth 3 (three) times daily as needed.  . desogestrel-ethinyl estradiol (AZURETTE) 0.15-0.02/0.01 MG (21/5) tablet Take 1 tablet by mouth every morning.  . norethindrone-ethinyl estradiol (JUNEL FE,GILDESS FE,LOESTRIN FE) 1-20 MG-MCG tablet Take 1 tablet by mouth daily.        Pt reports taking medications as prescribed.  Sexual History:   History  Sexual Activity  . Sexual Activity: Yes  . Birth Control/ Protection: Pill    Abuse/Trauma History: Pt denies any abuse hx.   Psychiatric History:  Pt was seen Sophomore year by a provider and dx w/ ADHD by Dr. Toni ArthursFuller.  Pt poor response to medication and later dx w/ Auditory processing D/O by Eliott NineMichie Dew.  Pt f/u w/ Eliott NineMichie Dew and Merri Brunetteandace Smith after death of her grandmother and reports she was tx her for Depression- but not good fit w/ Eliott NineMichie Dew and didn't have good response to antidepressants prescribed.  Pt reports she is "tired of tx" and feels like Israelguinea pig w/ all the medication trials.  Pt reports she will follow through w/ counseling.    Family Med/Psych History:  Family History  Problem Relation Age of Onset  . Heart disease Maternal Grandfather   . Hypertension Paternal Grandfather   . Cancer Maternal Grandmother     pancreatic   Hx of grandmother  being diagnosed w/ Bipolar Disorder.   Risk of Suicide/Violence: low Pt reports only hx of SI day presented at ER in June 2015.  Pt reported no other SI, intent or plan and no hx of any self harm.  Pt no hx of aggression.   Impression/DX:  Pt is a 18 y/o female who presents to establish counseling.  Pt reports hx of mood instability that has increased over this past year.  Symptoms endorsed include depressed moods, extreme irritability, anxiety, sleep disturbance, racing thoughts, feelings of worthlessness, feeling disappointment to parents.  Pt reports was seeking tx at [facility] and having poor response to medications tried.  Pt reports stressors of relationship w/ parents.  Mom reports hx of lack or respect and lying.  Pt report hx of marijuana use this past year, that became "almost daily" for 6 months till DUI in June 2015.  Pt denies SI or self harm and no hx of trauma.     Disposition/Plan:  Pt to continue medication management w/ Kendrick FriesMeghan Blankmann, NP.  Pt to f/u w/in 2 weeks for individual counseling using CBT, strength based, wellness strategies and building distress tolerance.   Diagnosis:     Episodic mood disorder    .        Forde RadonYATES,Antavius Sperbeck, LPC

## 2014-06-16 ENCOUNTER — Telehealth: Payer: Self-pay | Admitting: Nurse Practitioner

## 2014-06-16 NOTE — Telephone Encounter (Signed)
Attempted to reach patient at number. Patient did not answer and voicemail box was full. Will try to reach again later today.

## 2014-06-16 NOTE — Telephone Encounter (Signed)
Spoke with patient. Patient states that she was taking Azurette and was having a lot of "break through bleeding." "I was having like two periods a month." Patient states that she was switched to Loestrin Fe and that she is having the same problem. Patient states that she had her cycle last week and now started again last night. Patient states that bleeding is heavier than her normal cycle. Patient states that she has been taking the pills at the same time daily. Patient would like to know what she should do now. Advised patient that I would send a message over to Lauro FranklinPatricia Rolen-Grubb, FNP and give patient a call back with further instructions. Patient agreeable.

## 2014-06-16 NOTE — Telephone Encounter (Signed)
Patient is having some bleeding on her current b.c.

## 2014-06-17 NOTE — Telephone Encounter (Signed)
We have had this discussion with mother and patient in the past.  I still suspect compliance is a part of the problems.  But lets suggest a different approach with Nuva Ring or Nexplanon.  If Nexplanon will need a consult with MD prior to insertion.

## 2014-06-17 NOTE — Telephone Encounter (Signed)
Spoke with patient. Advised of message as seen below from Renee FranklinPatricia Rolen-Grubb, FNP. Patient states "I know myself I don't want to have to remember taking something in and out." Patient would like to discuss nexplanon. Appointment scheduled for Monday 8/17 at 11am with Dr.Lathrop. Patient agreeable to date and time.  Cc: Dr.Lathrop  Routing to provider for final review. Patient agreeable to disposition. Will close encounter

## 2014-06-21 ENCOUNTER — Ambulatory Visit (INDEPENDENT_AMBULATORY_CARE_PROVIDER_SITE_OTHER): Payer: BC Managed Care – PPO | Admitting: Gynecology

## 2014-06-21 ENCOUNTER — Encounter: Payer: Self-pay | Admitting: Gynecology

## 2014-06-21 VITALS — BP 106/72 | Resp 12 | Ht 68.0 in | Wt 178.0 lb

## 2014-06-21 DIAGNOSIS — N949 Unspecified condition associated with female genital organs and menstrual cycle: Secondary | ICD-10-CM

## 2014-06-21 DIAGNOSIS — N938 Other specified abnormal uterine and vaginal bleeding: Secondary | ICD-10-CM | POA: Insufficient documentation

## 2014-06-21 MED ORDER — ETONOGESTREL-ETHINYL ESTRADIOL 0.12-0.015 MG/24HR VA RING: VAGINAL_RING | VAGINAL | Status: DC

## 2014-06-21 MED ORDER — DESOGESTREL-ETHINYL ESTRADIOL 0.15-0.02/0.01 MG (21/5) PO TABS: 1.0000 | ORAL_TABLET | Freq: Every morning | ORAL | Status: DC

## 2014-06-21 NOTE — Progress Notes (Signed)
Subjective:     Patient ID: Renee Mcclain, female   DOB: 08/23/1996, 18 y.o.   MRN: [id number]  HPI Comments: Pt here to discuss contraception.  Pt was on Mirecette for almost 2y and then began having break through bleeding on pill.  Pt was then changed to LoEstrin 1/20 for past month.  Pt is sexually active and is not using condoms.  Current partner 1.5y, 5 lifetime partners.  Pt has been on abilify for 1w.  She was on vivance before this and adderal as well.  Pt is taking ocp at same time of day.  pt denies change in bowel movements, weight slowly increased.   Pt sees Dr Katrinka BlazingSmith, and believes she had a recent TSH and was normal, done for evaluation of her depression.  0.59 Pt does not like current ocp due to acne and still issue with bleeding, prefers to use mircette     Review of Systems  Gastrointestinal: Positive for constipation.       Objective:   Physical Exam  Vitals reviewed. Constitutional: She is oriented to person, place, and time. She appears well-developed and well-nourished.  Genitourinary: Vagina normal and uterus normal. There is no rash, tenderness or lesion on the right labia. There is lesion on the left labia. There is no rash or tenderness on the left labia. Cervix exhibits no motion tenderness, no discharge and no friability. Right adnexum displays no mass and no tenderness. Left adnexum displays no mass and no tenderness. No erythema or tenderness around the vagina. No signs of injury around the vagina. No vaginal discharge found.  Lymphadenopathy:       Right: No inguinal adenopathy present.       Left: No inguinal adenopathy present.  Neurological: She is alert and oriented to person, place, and time.       Assessment:     New onset of DUB on ocp     Plan:     TSH at PCP normal PRL, GC/CTM from cervix-never done before and no condom use Discussed medications that can affect absorption of ocp, as well as compliance Pt preferred mircette to current ocp,  offered nuvaring, instructed  On use, will place at the onset of next pill pack-agreeable If evaluation remains normal and nuvaring does not control bleeding, PUS-agreeable  25mm spent counseling, >50% face to face

## 2014-06-22 LAB — PROLACTIN: Prolactin: 11.2 ng/mL

## 2014-06-23 ENCOUNTER — Telehealth: Payer: Self-pay | Admitting: Gynecology

## 2014-06-23 DIAGNOSIS — N938 Other specified abnormal uterine and vaginal bleeding: Secondary | ICD-10-CM

## 2014-06-23 LAB — IPS N GONORRHOEA AND CHLAMYDIA BY PCR

## 2014-06-23 NOTE — Telephone Encounter (Signed)
Left message to call Kaitlyn at [phone]. 

## 2014-06-23 NOTE — Telephone Encounter (Signed)
Patient called yesterday and left message saying she was supposed to get a rx for a different med when she was here on Monday but we gave her the same med she had. Did not leave name of medication

## 2014-06-24 MED ORDER — ETONOGESTREL-ETHINYL ESTRADIOL 0.12-0.015 MG/24HR VA RING: VAGINAL_RING | VAGINAL | Status: DC

## 2014-06-24 NOTE — Telephone Encounter (Signed)
Spoke with patient. Patient states that she was supposed to pick up Nuvaring but when she went to the pharmacy it was her old OCP that was filled. Patient still has rx unopened. Sent in rx for Nuvaring to pharmacy on file as it was sent as a "sample". Nuvaring #3 2RF escribed to the pharmacy. Spoke with pharmacist and explained situation. He states that both prescriptions are at $0 for patient so it will be okay for patient to pick up the Nuvaring. Patient aware rx resent to pharmacy.  Routing to provider for final review. Patient agreeable to disposition. Will close encounter

## 2014-06-25 ENCOUNTER — Telehealth (HOSPITAL_COMMUNITY): Payer: Self-pay

## 2014-06-29 ENCOUNTER — Ambulatory Visit (INDEPENDENT_AMBULATORY_CARE_PROVIDER_SITE_OTHER): Payer: BC Managed Care – PPO | Admitting: Psychology

## 2014-06-29 DIAGNOSIS — F39 Unspecified mood [affective] disorder: Secondary | ICD-10-CM

## 2014-06-29 NOTE — Progress Notes (Signed)
   THERAPIST PROGRESS NOTE  Session Time: 11.08am-11.38am  Participation Level: Active  Behavioral Response: Well GroomedAlertEuthymic  Type of Therapy: Individual Therapy  Treatment Goals addressed: Diagnosis: Mood d/o NOS and goal 1.  Interventions: Motivational Interviewing and Psychosocial Skills: Communication skills  Summary: Renee Mcclain is a 18 y.o. female who presents with full and bright affect.  Pt reported that she decided she wasn't taking medication prescribed and never started.  Pt reports that she is tired of meds.  Pt reports that her mood has been good.  Pt reports she has started work at [employer] and started online classes through [employer]  Pt reports she is taking random classes that sounded of interest as was told by parents she needed to take something. Pt reports not motivated for school as doesn't know what she wants to work towards.  Pt reports she currently is "grounded" by parents- having to ask permission to go places till she completes her community service hours.  Pt reports she isn't motivated for this either as dislikes the task- but will complete to meet court agreement.  Pt reported argument w/ parents this week as a result on miscommunication again.  Pt had permission to visit boyfriend and parents upset that they went to get food to eat as well- stating didn't have permission.  Pt felt this wasn't justified as didn't indicate not to go elsewhere and had never been in issue in the past.  Pt feels that parents are assuming she is making bad choices.  Pt reports no further use of drugs.  Pt receptive to changing how she communicates when in disagreement to assist in deescalating- as stated to parent they were being ridiculous and aware this didn't help.     Suicidal/Homicidal: Nowithout intent/plan  Therapist Response: Assessed pt current functioning per pt report.  Explored w/pt start of school and work.  Had pt identify what her wants are and how this  impacting her follow through on parents expectations.  Processed interactions w/ parents, miscommunciation and how pt response impacts conflict.   Encouraged assertive responses that don't blame, name call or accuse.   Plan: Return again in 2 weeks.  Diagnosis: Axis I: Mood Disorder NOS    Axis II: No diagnosis    YATES,LEANNE, LPC 06/29/2014

## 2014-07-09 ENCOUNTER — Ambulatory Visit (HOSPITAL_COMMUNITY): Payer: Self-pay | Admitting: Psychiatry

## 2014-07-16 ENCOUNTER — Telehealth (HOSPITAL_COMMUNITY): Payer: Self-pay | Admitting: Psychology

## 2014-07-16 ENCOUNTER — Encounter (HOSPITAL_COMMUNITY): Payer: Self-pay | Admitting: Psychology

## 2014-07-16 NOTE — Progress Notes (Signed)
This encounter was created in error - please disregard.

## 2014-07-16 NOTE — Telephone Encounter (Signed)
Pt didn't show for appointment. Called to pt and informed of missed appointment and how she was doing. Pt reported she forgot about the appointment and would like to reschedule. Pt was informed that this appointment would be a no-show but she is welcome to reschedule.   Pt was transferred to front office for scheduling.

## 2014-09-15 ENCOUNTER — Other Ambulatory Visit: Payer: Self-pay | Admitting: Gastroenterology

## 2014-09-15 DIAGNOSIS — R1011 Right upper quadrant pain: Secondary | ICD-10-CM

## 2014-09-15 DIAGNOSIS — R112 Nausea with vomiting, unspecified: Secondary | ICD-10-CM

## 2014-09-16 ENCOUNTER — Ambulatory Visit
Admission: RE | Admit: 2014-09-16 | Discharge: 2014-09-16 | Disposition: A | Discharge status: Home / Self Care | Payer: BC Managed Care – PPO | Source: Ambulatory Visit | Attending: Gastroenterology | Admitting: Gastroenterology

## 2014-09-16 DIAGNOSIS — R112 Nausea with vomiting, unspecified: Secondary | ICD-10-CM

## 2014-09-16 DIAGNOSIS — R1011 Right upper quadrant pain: Secondary | ICD-10-CM

## 2014-10-05 ENCOUNTER — Other Ambulatory Visit (HOSPITAL_COMMUNITY): Payer: Self-pay | Admitting: *Deleted

## 2014-10-05 NOTE — Telephone Encounter (Signed)
Received refill request for Abilify. According to fax from pharmacy, last filled 06/08/14. Only appt in office with NP on 06/08/14, no further follow up appointments made. Provider no longer with practice.  Patient called office 06/25/2014 message taken by [facility], sent to Kendrick FriesMeghan Blankmann, NP   "Comment: 06/25/14 8:39AM Patient called stating that she is no longer taking her medication - Abilify pt stated that she never started the medication trying to do without the medication will call back if having problems.Marland Kitchen.Renee Mcclain/sh" Refill denied - fax sent to pharmacy with note for pt to contact office to make appt

## 2014-10-08 ENCOUNTER — Inpatient Hospital Stay (HOSPITAL_COMMUNITY)
Admission: AD | Admit: 2014-10-08 | Discharge: 2014-10-12 | DRG: 885 | Disposition: A | Discharge status: Home / Self Care | Payer: BC Managed Care – PPO | Source: Intra-hospital | Attending: Psychiatry | Admitting: Psychiatry

## 2014-10-08 ENCOUNTER — Other Ambulatory Visit: Payer: Self-pay

## 2014-10-08 ENCOUNTER — Encounter (HOSPITAL_COMMUNITY): Payer: Self-pay | Admitting: *Deleted

## 2014-10-08 ENCOUNTER — Emergency Department (HOSPITAL_COMMUNITY): Payer: BC Managed Care – PPO

## 2014-10-08 ENCOUNTER — Emergency Department (HOSPITAL_COMMUNITY)
Admission: EM | Admit: 2014-10-08 | Discharge: 2014-10-08 | Disposition: A | Discharge status: Other Acute Inpatient Hospital | Payer: BC Managed Care – PPO | Attending: Emergency Medicine | Admitting: Emergency Medicine

## 2014-10-08 DIAGNOSIS — F1020 Alcohol dependence, uncomplicated: Secondary | ICD-10-CM | POA: Diagnosis present

## 2014-10-08 DIAGNOSIS — J45909 Unspecified asthma, uncomplicated: Secondary | ICD-10-CM | POA: Insufficient documentation

## 2014-10-08 DIAGNOSIS — Y9289 Other specified places as the place of occurrence of the external cause: Secondary | ICD-10-CM | POA: Insufficient documentation

## 2014-10-08 DIAGNOSIS — R000 Tachycardia, unspecified: Secondary | ICD-10-CM | POA: Diagnosis not present

## 2014-10-08 DIAGNOSIS — Z23 Encounter for immunization: Secondary | ICD-10-CM | POA: Diagnosis not present

## 2014-10-08 DIAGNOSIS — G4700 Insomnia, unspecified: Secondary | ICD-10-CM | POA: Diagnosis present

## 2014-10-08 DIAGNOSIS — R4182 Altered mental status, unspecified: Secondary | ICD-10-CM | POA: Diagnosis not present

## 2014-10-08 DIAGNOSIS — Z808 Family history of malignant neoplasm of other organs or systems: Secondary | ICD-10-CM

## 2014-10-08 DIAGNOSIS — F3162 Bipolar disorder, current episode mixed, moderate: Secondary | ICD-10-CM | POA: Diagnosis present

## 2014-10-08 DIAGNOSIS — Y998 Other external cause status: Secondary | ICD-10-CM | POA: Diagnosis not present

## 2014-10-08 DIAGNOSIS — Z3202 Encounter for pregnancy test, result negative: Secondary | ICD-10-CM | POA: Insufficient documentation

## 2014-10-08 DIAGNOSIS — F319 Bipolar disorder, unspecified: Secondary | ICD-10-CM | POA: Diagnosis present

## 2014-10-08 DIAGNOSIS — F3160 Bipolar disorder, current episode mixed, unspecified: Secondary | ICD-10-CM

## 2014-10-08 DIAGNOSIS — F411 Generalized anxiety disorder: Secondary | ICD-10-CM | POA: Diagnosis present

## 2014-10-08 DIAGNOSIS — Z79899 Other long term (current) drug therapy: Secondary | ICD-10-CM | POA: Insufficient documentation

## 2014-10-08 DIAGNOSIS — Y9389 Activity, other specified: Secondary | ICD-10-CM | POA: Diagnosis not present

## 2014-10-08 DIAGNOSIS — X58XXXA Exposure to other specified factors, initial encounter: Secondary | ICD-10-CM | POA: Diagnosis not present

## 2014-10-08 DIAGNOSIS — Z872 Personal history of diseases of the skin and subcutaneous tissue: Secondary | ICD-10-CM | POA: Insufficient documentation

## 2014-10-08 DIAGNOSIS — F1290 Cannabis use, unspecified, uncomplicated: Secondary | ICD-10-CM | POA: Diagnosis present

## 2014-10-08 DIAGNOSIS — Z8249 Family history of ischemic heart disease and other diseases of the circulatory system: Secondary | ICD-10-CM | POA: Diagnosis not present

## 2014-10-08 DIAGNOSIS — T50902A Poisoning by unspecified drugs, medicaments and biological substances, intentional self-harm, initial encounter: Secondary | ICD-10-CM

## 2014-10-08 DIAGNOSIS — T424X2A Poisoning by benzodiazepines, intentional self-harm, initial encounter: Secondary | ICD-10-CM | POA: Insufficient documentation

## 2014-10-08 DIAGNOSIS — F909 Attention-deficit hyperactivity disorder, unspecified type: Secondary | ICD-10-CM | POA: Diagnosis present

## 2014-10-08 LAB — COMPREHENSIVE METABOLIC PANEL
ALT: 10 U/L (ref 0–35)
AST: 17 U/L (ref 0–37)
Albumin: 4.6 g/dL (ref 3.5–5.2)
Alkaline Phosphatase: 51 U/L (ref 39–117)
Anion gap: 15 (ref 5–15)
BUN: 10 mg/dL (ref 6–23)
CO2: 23 mEq/L (ref 19–32)
Calcium: 10 mg/dL (ref 8.4–10.5)
Chloride: 101 mEq/L (ref 96–112)
Creatinine, Ser: 0.86 mg/dL (ref 0.50–1.10)
GFR calc Af Amer: >90 mL/min (ref >90)
GFR calc non Af Amer: >90 mL/min (ref >90)
Glucose, Bld: 87 mg/dL (ref 70–99)
Potassium: 3.7 mEq/L (ref 3.7–5.3)
Sodium: 139 mEq/L (ref 137–147)
Total Bilirubin: 0.8 mg/dL (ref 0.3–1.2)
Total Protein: 7.9 g/dL (ref 6.0–8.3)

## 2014-10-08 LAB — CBC WITH DIFFERENTIAL/PLATELET
Basophils Absolute: 0 10*3/uL (ref 0.0–0.1)
Basophils Relative: 0 % (ref 0–1)
Eosinophils Absolute: 0.2 10*3/uL (ref 0.0–0.7)
Eosinophils Relative: 2 % (ref 0–5)
HCT: 45.7 % (ref 36.0–46.0)
Hemoglobin: 15.2 g/dL — ABNORMAL HIGH (ref 12.0–15.0)
Lymphocytes Relative: 29 % (ref 12–46)
Lymphs Abs: 3.3 10*3/uL (ref 0.7–4.0)
MCH: 30.2 pg (ref 26.0–34.0)
MCHC: 33.3 g/dL (ref 30.0–36.0)
MCV: 90.9 fL (ref 78.0–100.0)
Monocytes Absolute: 0.7 10*3/uL (ref 0.1–1.0)
Monocytes Relative: 6 % (ref 3–12)
Neutro Abs: 7.4 10*3/uL (ref 1.7–7.7)
Neutrophils Relative %: 63 % (ref 43–77)
Platelets: 323 10*3/uL (ref 150–400)
RBC: 5.03 MIL/uL (ref 3.87–5.11)
RDW: 12.6 % (ref 11.5–15.5)
WBC: 11.6 10*3/uL — ABNORMAL HIGH (ref 4.0–10.5)

## 2014-10-08 LAB — ACETAMINOPHEN LEVEL: Acetaminophen (Tylenol), Serum: <15 ug/mL (ref 10–30)

## 2014-10-08 LAB — URINALYSIS, ROUTINE W REFLEX MICROSCOPIC
Glucose, UA: NEGATIVE mg/dL
Hgb urine dipstick: NEGATIVE
Ketones, ur: 15 mg/dL — AB
Leukocytes, UA: NEGATIVE
Nitrite: NEGATIVE
Protein, ur: NEGATIVE mg/dL
Specific Gravity, Urine: 1.028 (ref 1.005–1.030)
Urobilinogen, UA: 1 mg/dL (ref 0.0–1.0)
pH: 6 (ref 5.0–8.0)

## 2014-10-08 LAB — RAPID URINE DRUG SCREEN, HOSP PERFORMED
Amphetamines: NOT DETECTED
Barbiturates: NOT DETECTED
Benzodiazepines: POSITIVE — AB
Cocaine: NOT DETECTED
Opiates: NOT DETECTED
Tetrahydrocannabinol: POSITIVE — AB

## 2014-10-08 LAB — PREGNANCY, URINE: Preg Test, Ur: NEGATIVE

## 2014-10-08 LAB — SALICYLATE LEVEL: Salicylate Lvl: <2 mg/dL — ABNORMAL LOW (ref 2.8–20.0)

## 2014-10-08 MED ORDER — ARIPIPRAZOLE 5 MG PO TABS
5.0000 mg | ORAL_TABLET | Freq: Every day | ORAL | Status: DC
  Administered 2014-10-08 – 2014-10-11 (×4): 5 mg via ORAL
  Filled 2014-10-08 (×5): qty 1

## 2014-10-08 MED ORDER — MAGNESIUM HYDROXIDE 400 MG/5ML PO SUSP: 30.0000 mL | Freq: Every day | ORAL | Status: DC | PRN

## 2014-10-08 MED ORDER — ALUM & MAG HYDROXIDE-SIMETH 200-200-20 MG/5ML PO SUSP: 30.0000 mL | ORAL | Status: DC | PRN

## 2014-10-08 MED ORDER — ACETAMINOPHEN 325 MG PO TABS: 650.0000 mg | ORAL_TABLET | ORAL | Status: DC | PRN

## 2014-10-08 MED ORDER — HYDROXYZINE HCL 25 MG PO TABS: 25.0000 mg | ORAL_TABLET | Freq: Three times a day (TID) | ORAL | Status: DC | PRN

## 2014-10-08 MED ORDER — ACETAMINOPHEN 325 MG PO TABS: 650.0000 mg | ORAL_TABLET | Freq: Four times a day (QID) | ORAL | Status: DC | PRN

## 2014-10-08 MED ORDER — ONDANSETRON HCL 4 MG PO TABS: 4.0000 mg | ORAL_TABLET | Freq: Three times a day (TID) | ORAL | Status: DC | PRN

## 2014-10-08 MED ORDER — NALOXONE HCL 1 MG/ML IJ SOLN: 1.0000 mg | Freq: Once | INTRAMUSCULAR | Status: DC

## 2014-10-08 MED ORDER — HYDROXYZINE HCL 50 MG PO TABS
50.0000 mg | ORAL_TABLET | Freq: Every day | ORAL | Status: DC
  Administered 2014-10-08 – 2014-10-11 (×4): 50 mg via ORAL
  Filled 2014-10-08 (×6): qty 1

## 2014-10-08 MED ORDER — PNEUMOCOCCAL VAC POLYVALENT 25 MCG/0.5ML IJ INJ
0.5000 mL | INJECTION | INTRAMUSCULAR | Status: AC
  Administered 2014-10-09: 0.5 mL via INTRAMUSCULAR

## 2014-10-08 MED ORDER — IBUPROFEN 200 MG PO TABS
600.0000 mg | ORAL_TABLET | Freq: Three times a day (TID) | ORAL | Status: DC | PRN
Start: 2014-10-08 — End: 2014-10-08

## 2014-10-08 MED ORDER — ALBUTEROL SULFATE HFA 108 (90 BASE) MCG/ACT IN AERS: 2.0000 | INHALATION_SPRAY | Freq: Four times a day (QID) | RESPIRATORY_TRACT | Status: DC | PRN

## 2014-10-08 MED ORDER — OLANZAPINE 10 MG PO TBDP
10.0000 mg | ORAL_TABLET | Freq: Once | ORAL | Status: AC
  Administered 2014-10-08: 10 mg via ORAL
  Filled 2014-10-08: qty 1

## 2014-10-08 MED ORDER — INFLUENZA VAC SPLIT QUAD 0.5 ML IM SUSY
0.5000 mL | PREFILLED_SYRINGE | INTRAMUSCULAR | Status: AC
  Administered 2014-10-09: 0.5 mL via INTRAMUSCULAR
  Filled 2014-10-08: qty 0.5

## 2014-10-08 NOTE — BH Assessment (Addendum)
Tele Assessment Note   Renee GleeSarah E Mcclain is an 18 y.o. female that [facility] after an attempt to commit suicide by overdosing on 15 clonazepam pills.  Pt stated she got a DUI last night (although she denies alcohol or drug use) and that she was charged with a hit and run for hitting a mail box.  "I had a bad day."  Pt also stated, "I don't handle stressful situations well."  Pt continues to endorse SI and stated she didn't feel "taken seriously" when she was brought to ED by her father in June for SI.  Pt stated she [facility] and then got a counseling appt for 2 months later.  Pt stated that wouldn't help her and didn't follow up with counseling appt.  She was prescribed Abilify and Vistaril and stated that the Abilify helps some with her depressive sx.  Pt was irritable, reports no sleep, mood swings, irritability, anger, and reports she has lost 20 lbs because "I can't keep food down."  Pt endorses anxiety.  Pt denies HI or AVH and no delusions noted.  Pt denies SA (although has DUI).  She stated she used to smoke marijuana but that her parents made her stop.  Pt presents as irritable, has rapid and pressured speech, had logical/coherent thought processes, was oriented x 4, had appropriate affect, and was cooperative with assessment.  Pt stated current stressors are having a DUI and upcoming court date 11/18/14, having to go to jail last night, and that "my boyfriend is shit."  Pt stated her parents are supportive.  She stated she lives alone, works full time at [employer] and is attending [employer] online.  Inpatient psychiatric stabilization recommended for pt at this time by this clinician due to overdose on medication as suicide attempt.  Consulted with Elmore GuiseShelli Eisbach, NP @ [phone], who accepted pt to [facility].  Consulted with Thurman CoyerEric Kaplan, [facility], RN, who stated pt assigned bed number 305-1 to Dr. Jama Flavorsobos and [facility].  Updated TTS and ED  staff.  Axis I: 296.33 Major Depressive Disorder, Recurrent Episode, Severe Axis II: Deferred Axis III:  Past Medical History  Diagnosis Date  . Asthma     "grew out of it around 18 years of age"  . Acne   . Menorrhagia   . Attention deficit hyperactivity disorder (ADHD)     misdiagnosis  . Auditory processing disorder 2014   Axis IV: other psychosocial or environmental problems, problems related to legal system/crime, problems related to social environment and problems with primary support group Axis V: 21-30 behavior considerably influenced by delusions or hallucinations OR serious impairment in judgment, communication OR inability to function in almost all areas  Past Medical History:  Past Medical History  Diagnosis Date  . Asthma     "grew out of it around 18 years of age"  . Acne   . Menorrhagia   . Attention deficit hyperactivity disorder (ADHD)     misdiagnosis  . Auditory processing disorder 2014    Past Surgical History  Procedure Laterality Date  . Tympanostomy    . Dental surgery      Family History:  Family History  Problem Relation Age of Onset  . Heart disease Maternal Grandfather   . Hypertension Paternal Grandfather   . Cancer Maternal Grandmother     pancreatic    Social History:  reports that she has never smoked. She  has never used smokeless tobacco. She reports that she does not drink alcohol or use illicit drugs.  Additional Social History:  Alcohol / Drug Use Pain Medications: see med list Prescriptions: see med list Over the Counter: see med list History of alcohol / drug use?: Yes (pt denies current use) Longest period of sobriety (when/how long): unknown Negative Consequences of Use: Legal Withdrawal Symptoms:  (na - pt denies)  CIWA: CIWA-Ar BP: 135/84 mmHg Pulse Rate: 117 COWS:    PATIENT STRENGTHS: (choose at least two) Ability for insight Average or above average intelligence Capable of independent living General fund of  knowledge Supportive family/friends Work skills  Allergies:  Allergies  Allergen Reactions  . Azithromycin Hives    Home Medications:  (Not in a hospital admission)  OB/GYN Status:  Patient's last menstrual period was 09/30/2014.  General Assessment Data Location of Assessment: [facility] Assessment Services Is this a Tele or Face-to-Face Assessment?: Tele Assessment Is this an Initial Assessment or a Re-assessment for this encounter?: Initial Assessment Living Arrangements: Alone Can pt return to current living arrangement?: Yes Admission Status: Voluntary Is patient capable of signing voluntary admission?: Yes Transfer from: Acute Hospital Referral Source: Self/Family/Friend     [facility] Living Arrangements: Alone Name of Psychiatrist: none Name of Therapist: none  Education Status Is patient currently in school?: Yes Current Grade: community college Highest grade of school patient has completed: some college Name of school: [employer]  Risk to self with the past 6 months Suicidal Ideation: Yes-Currently Present Suicidal Intent: Yes-Currently Present Is patient at risk for suicide?: Yes Suicidal Plan?: Yes-Currently Present Specify Current Suicidal Plan: Pt overdosed on medication Access to Means: Yes Specify Access to Suicidal Means: Pt had access to medications What has been your use of drugs/alcohol within the last 12 months?: pt denies current use, but got DUI last night Previous Attempts/Gestures: No How many times?: 0 (pt has had SI in past) Other Self Harm Risks: na - pt denies Triggers for Past Attempts: None known Intentional Self Injurious Behavior: None Family Suicide History: Yes (Uncle) Recent stressful life event(s): Conflict (Comment), Other (Comment) (suicide attempt, DUI. conflict with boyfriend) Persecutory voices/beliefs?: No Depression: Yes Depression Symptoms: Despondent, Insomnia, Loss of interest in usual pleasures, Feeling  worthless/self pity, Feeling angry/irritable Substance abuse history and/or treatment for substance abuse?: Yes Suicide prevention information given to non-admitted patients: Not applicable  Risk to Others within the past 6 months Homicidal Ideation: No Thoughts of Harm to Others: No Current Homicidal Intent: No Current Homicidal Plan: No Access to Homicidal Means: No Identified Victim: na - pt denies History of harm to others?: No Assessment of Violence: None Noted Violent Behavior Description: na - pt cooperative Does patient have access to weapons?: No Criminal Charges Pending?: Yes Describe Pending Criminal Charges: DUI/Hit and Run Does patient have a court date: Yes Court Date: 11/18/14  Psychosis Hallucinations: None noted Delusions: None noted  Mental Status Report Appear/Hygiene: Disheveled, In scrubs Eye Contact: Good Motor Activity: Freedom of movement, Unremarkable Speech: Logical/coherent Level of Consciousness: Alert, Irritable Mood: Depressed, Irritable Affect: Appropriate to circumstance Anxiety Level: None Thought Processes: Coherent, Relevant Judgement: Impaired Orientation: Person, Place, Time, Situation Obsessive Compulsive Thoughts/Behaviors: None  Cognitive Functioning Concentration: Decreased Memory: Recent Intact, Remote Intact IQ: Average Insight: Poor Impulse Control: Poor Appetite: Poor Weight Loss: 20 Weight Gain: 0 Sleep: Decreased Total Hours of Sleep:  (unknown - reports not sleeping) Vegetative Symptoms: None  ADLScreening [facility] Assessment Services) Patient's cognitive ability adequate to  safely complete daily activities?: Yes Patient able to express need for assistance with ADLs?: No Independently performs ADLs?: Yes (appropriate for developmental age)  Prior Inpatient Therapy Prior Inpatient Therapy: No Prior Therapy Dates: na Prior Therapy Facilty/Provider(s): na Reason for Treatment: na  Prior Outpatient Therapy Prior  Outpatient Therapy: Yes Prior Therapy Dates: 04/2014 Prior Therapy Facilty/Provider(s): [facility] OP clinic (once) Reason for Treatment: Med mgnt/therapy  ADL Screening (condition at time of admission) Patient's cognitive ability adequate to safely complete daily activities?: Yes Is the patient deaf or have difficulty hearing?: No Does the patient have difficulty seeing, even when wearing glasses/contacts?: No Does the patient have difficulty concentrating, remembering, or making decisions?: No Patient able to express need for assistance with ADLs?: No Does the patient have difficulty dressing or bathing?: No Independently performs ADLs?: Yes (appropriate for developmental age) Does the patient have difficulty walking or climbing stairs?: No  Home Assistive Devices/Equipment Home Assistive Devices/Equipment: None    Abuse/Neglect Assessment (Assessment to be complete while patient is alone) Physical Abuse: Denies Verbal Abuse: Denies Sexual Abuse: Denies Exploitation of patient/patient's resources: Denies Self-Neglect: Denies Values / Beliefs Cultural Requests During Hospitalization: None Spiritual Requests During Hospitalization: None Consults Spiritual Care Consult Needed: No Social Work Consult Needed: No Merchant navy officer (For Healthcare) Does patient have an advance directive?: No Would patient like information on creating an advanced directive?: No - patient declined information    Additional Information 1:1 In Past 12 Months?: No CIRT Risk: No Elopement Risk: No Does patient have medical clearance?: Yes     Disposition:  Disposition Initial Assessment Completed for this Encounter: Yes Disposition of Patient: Inpatient treatment program Type of inpatient treatment program: Adult Patient referred to:  (Pt accepted [facility])  Casimer Lanius, MS, [facility] Licensed Professional Counselor Therapeutic Triage Specialist [facility] Phone: [phone] Fax:  [phone]  10/08/2014 3:15 PM

## 2014-10-08 NOTE — ED Provider Notes (Signed)
CSN: [id number]     Arrival date & time 10/08/14  1150 History   First MD Initiated Contact with Patient 10/08/14 1159     Chief Complaint  Patient presents with  . Drug Overdose     (Consider location/radiation/quality/duration/timing/severity/associated sxs/prior Treatment) HPI   18yF with intentional overdose. Took ~15 0.5 clonazepam shortly before arrival. "I'm tired of this. I'm having a hard time. I just don't want to do this anymore. I keep letting everyone down." Reports DUI last night. Pt with hx of depression. Psychiatric evaluation this summer with very similar circumstances.   She denies coingestion. No HI. No hallucinations. Denies pain. No SOB. No n/v.   Past Medical History  Diagnosis Date  . Asthma     "grew out of it around 18 years of age"  . Acne   . Menorrhagia   . Attention deficit hyperactivity disorder (ADHD)     misdiagnosis  . Auditory processing disorder 2014   Past Surgical History  Procedure Laterality Date  . Tympanostomy    . Dental surgery     Family History  Problem Relation Age of Onset  . Heart disease Maternal Grandfather   . Hypertension Paternal Grandfather   . Cancer Maternal Grandmother     pancreatic   History  Substance Use Topics  . Smoking status: Never Smoker   . Smokeless tobacco: Never Used  . Alcohol Use: No   OB History    Gravida Para Term Preterm AB TAB SAB Ectopic Multiple Living   0              Review of Systems  All systems reviewed and negative, other than as noted in HPI.   Allergies  Azithromycin  Home Medications   Prior to Admission medications   Medication Sig Start Date End Date Taking? Authorizing Provider  ARIPiprazole (ABILIFY) 5 MG tablet Take 1 tablet (5 mg total) by mouth daily. 06/08/14 06/08/15  Kendrick FriesMeghan Blankmann, NP  etonogestrel-ethinyl estradiol (NUVARING) 0.12-0.015 MG/24HR vaginal ring Insert vaginally and leave in place for 4 consecutive weeks, then remove for 3d 06/24/14   Douglass Riversracy  Lathrop, MD  hydrOXYzine (ATARAX/VISTARIL) 25 MG tablet Take 1 tablet (25 mg total) by mouth 3 (three) times daily as needed. 06/08/14   Meghan Blankmann, NP   BP 135/84 mmHg  Pulse 101  Temp(Src) 98.4 F (36.9 C) (Oral)  Resp 16  SpO2 100%  LMP 09/30/2014 Physical Exam  Constitutional: She appears well-developed and well-nourished. No distress.  HENT:  Head: Normocephalic and atraumatic.  Eyes: Conjunctivae are normal. Right eye exhibits no discharge. Left eye exhibits no discharge.  Neck: Neck supple.  Cardiovascular: Regular rhythm and normal heart sounds.  Exam reveals no gallop and no friction rub.   No murmur heard. Mild tachycardia  Pulmonary/Chest: Effort normal and breath sounds normal. No respiratory distress.  Abdominal: Soft. She exhibits no distension. There is no tenderness.  Musculoskeletal: She exhibits no edema or tenderness.  Neurological: She is alert.  Skin: Skin is warm and dry.  Psychiatric:  Crying.   Nursing note and vitals reviewed.   ED Course  Procedures (including critical care time) Labs Review Labs Reviewed  CBC WITH DIFFERENTIAL - Abnormal; Notable for the following:    WBC 11.6 (*)    Hemoglobin 15.2 (*)    All other components within normal limits  URINE RAPID DRUG SCREEN (HOSP PERFORMED) - Abnormal; Notable for the following:    Benzodiazepines POSITIVE (*)    Tetrahydrocannabinol POSITIVE (*)  All other components within normal limits  URINALYSIS, ROUTINE W REFLEX MICROSCOPIC - Abnormal; Notable for the following:    Color, Urine AMBER (*)    Bilirubin Urine SMALL (*)    Ketones, ur 15 (*)    All other components within normal limits  SALICYLATE LEVEL - Abnormal; Notable for the following:    Salicylate Lvl <2.0 (*)    All other components within normal limits  COMPREHENSIVE METABOLIC PANEL  PREGNANCY, URINE  ACETAMINOPHEN LEVEL    Imaging Review Ct Head Wo Contrast  10/08/2014   CLINICAL DATA:  Altered mental status  EXAM: CT  HEAD WITHOUT CONTRAST  TECHNIQUE: Contiguous axial images were obtained from the base of the skull through the vertex without intravenous contrast.  COMPARISON:  01/08/2010  FINDINGS: No acute hemorrhage, infarct, or mass lesion is identified. No midline shift. Ventricles are normal in size. Orbits and paranasal sinuses are unremarkable. No skull fracture.  IMPRESSION: Normal head CT.   Electronically Signed   By: Christiana PellantGretchen  Green M.D.   On: 10/08/2014 13:38     EKG Interpretation   Date/Time:  Friday October 08 2014 12:01:00 EST Ventricular Rate:  101 PR Interval:  139 QRS Duration: 77 QT Interval:  355 QTC Calculation: 460 R Axis:   41 Text Interpretation:  Sinus tachycardia Baseline wander in lead(s) II aVR  V4 V5 ED PHYSICIAN INTERPRETATION AVAILABLE IN [facility] Confirmed  by TEST, Record ([id number]) on 10/14/2014 3:12:41 PM      MDM   Final diagnoses:  Altered mental status  Intentional drug overdose, initial encounter    18 year old female with intentional drug overdose with intent for self-harm. History of depression.  Resistant to get help today citing that "nobody has been able to help me" yet downplays her own poor decisions. I cannot overlook her impulsive behavior and ingestion toda,y particularly with acute stressor of just getting a DUI.   I expect she'll be fine with regards to her reported ingestion. We'll continue to observe on telemetry and medically clear. Psychiatric evaluation after.   Raeford RazorStephen Adine Heimann, MD 10/15/14 [phone]

## 2014-10-08 NOTE — ED Notes (Signed)
Father at bedside-patient states she is not suicidal and wants to go home-explained to the patient the consequences of her behavior is that she needed to be observed for her mental status as well as her physical status-informed MD of her request to see him

## 2014-10-08 NOTE — ED Notes (Signed)
Telepsyche at bedside

## 2014-10-08 NOTE — ED Notes (Signed)
Pt stating she wanted to leave and did not want to talk to pysch, MD made aware. MD in room made pt and father aware that she is IVC'd and could not leave. Pt appears very upset about situation stating she did not and would not stay. Charge nurse went into room to talk with pt, pt appears calm.

## 2014-10-08 NOTE — ED Notes (Signed)
Pt presents to ED after taking 15 tablets of 0.5mg  Clonazepam. Pt tearful in triage. Pt sts she was trying to kill herself, then sts "I don't know." Father at bedside. Denies previous suicide attempts.

## 2014-10-09 DIAGNOSIS — F3160 Bipolar disorder, current episode mixed, unspecified: Secondary | ICD-10-CM

## 2014-10-09 MED ORDER — ADULT MULTIVITAMIN W/MINERALS CH
1.0000 | ORAL_TABLET | Freq: Every day | ORAL | Status: DC
  Administered 2014-10-10 – 2014-10-12 (×3): 1 via ORAL
  Filled 2014-10-09 (×6): qty 1

## 2014-10-09 MED ORDER — ONDANSETRON 4 MG PO TBDP: 4.0000 mg | ORAL_TABLET | Freq: Four times a day (QID) | ORAL | Status: AC | PRN

## 2014-10-09 MED ORDER — CHLORDIAZEPOXIDE HCL 25 MG PO CAPS
25.0000 mg | ORAL_CAPSULE | Freq: Three times a day (TID) | ORAL | Status: AC
  Administered 2014-10-10 – 2014-10-11 (×3): 25 mg via ORAL
  Filled 2014-10-09 (×3): qty 1

## 2014-10-09 MED ORDER — HYDROXYZINE HCL 25 MG PO TABS
25.0000 mg | ORAL_TABLET | Freq: Four times a day (QID) | ORAL | Status: AC | PRN
  Administered 2014-10-09 – 2014-10-11 (×3): 25 mg via ORAL
  Filled 2014-10-09 (×3): qty 1

## 2014-10-09 MED ORDER — CHLORDIAZEPOXIDE HCL 25 MG PO CAPS
25.0000 mg | ORAL_CAPSULE | ORAL | Status: AC
  Administered 2014-10-11 – 2014-10-12 (×2): 25 mg via ORAL
  Filled 2014-10-09 (×2): qty 1

## 2014-10-09 MED ORDER — VITAMIN B-1 100 MG PO TABS
100.0000 mg | ORAL_TABLET | Freq: Every day | ORAL | Status: DC
  Administered 2014-10-10 – 2014-10-12 (×3): 100 mg via ORAL
  Filled 2014-10-09 (×4): qty 1

## 2014-10-09 MED ORDER — DESOGESTREL-ETHINYL ESTRADIOL 0.15-0.02/0.01 MG (21/5) PO TABS: 1.0000 | ORAL_TABLET | Freq: Every day | ORAL | Status: DC

## 2014-10-09 MED ORDER — CHLORDIAZEPOXIDE HCL 25 MG PO CAPS
25.0000 mg | ORAL_CAPSULE | Freq: Every day | ORAL | Status: DC
Start: 2014-10-13 — End: 2014-10-12

## 2014-10-09 MED ORDER — THIAMINE HCL 100 MG/ML IJ SOLN
100.0000 mg | Freq: Once | INTRAMUSCULAR | Status: DC
  Filled 2014-10-09: qty 2

## 2014-10-09 MED ORDER — CHLORDIAZEPOXIDE HCL 25 MG PO CAPS
25.0000 mg | ORAL_CAPSULE | Freq: Four times a day (QID) | ORAL | Status: AC
  Administered 2014-10-09 – 2014-10-10 (×2): 25 mg via ORAL
  Filled 2014-10-09 (×3): qty 1

## 2014-10-09 MED ORDER — CHLORDIAZEPOXIDE HCL 25 MG PO CAPS
25.0000 mg | ORAL_CAPSULE | Freq: Four times a day (QID) | ORAL | Status: AC | PRN
  Administered 2014-10-10: 25 mg via ORAL
  Filled 2014-10-09 (×2): qty 1

## 2014-10-09 MED ORDER — NON FORMULARY: 1.0000 | Freq: Every morning | Status: DC

## 2014-10-09 MED ORDER — LOPERAMIDE HCL 2 MG PO CAPS: 2.0000 mg | ORAL_CAPSULE | ORAL | Status: AC | PRN

## 2014-10-09 NOTE — [facility] Group Notes (Signed)
[facility] LCSW Group Therapy 10/09/2014 1:07 PM  Type of Therapy and Topic: Group Therapy: Avoiding Self-Sabotaging and Enabling Behaviors   Pt did not attend, sleeping in room.   Chad CordialLauren Carter, LCSWA 10/09/2014 1:07 PM

## 2014-10-09 NOTE — Tx Team (Addendum)
Initial Interdisciplinary Treatment Plan   PATIENT STRESSORS: Educational concerns Health problems Legal issue Substance abuse   PATIENT STRENGTHS: Average or above average intelligence Communication skills Supportive family/friends Work skills   PROBLEM LIST: Problem List/Patient Goals Date to be addressed Date deferred Reason deferred Estimated date of resolution  "I don't handle stress very well" "I was upset I got a DUI, I told my dad, I need help, so I swallowed the pills" 10-08-14     Asthma 10-08-14     Anxiety 10-08-14     Depression 10-08-14                                    DISCHARGE CRITERIA:  Adequate post-discharge living arrangements Improved stabilization in mood, thinking, and/or behavior Need for constant or close observation no longer present Reduction of life-threatening or endangering symptoms to within safe limits Verbal commitment to aftercare and medication compliance  PRELIMINARY DISCHARGE PLAN: Attend PHP/IOP Participate in family therapy Return to previous work or school arrangements  PATIENT/FAMIILY INVOLVEMENT: This treatment plan has been presented to and reviewed with the patient, Renee Mcclain, and/or family member.  The patient and family have been given the opportunity to ask questions and make suggestions.  Mickeal NeedyJohnson, Renee Mcclain N 10/09/2014, 12:01 AM

## 2014-10-09 NOTE — Progress Notes (Signed)
[name] has spent most of the day sleeping in her bed..influenza her room. She is shy. She immediately tells this nurse, upon meeting her, that she doesn't need to be here and that she wants to know " now: when she will be going home.   A She did attend her group with her nurses today, asked for the handout that corresponds to the group and spoke with this nurse about  What her plans for the future oare. She also completed her morning assessment and on it she wrote she denies SI within the past 24 hrs, and she rated her depression, hopelessness and anxiety "2/2/8", respectively.    R Safety is in place and poc moves forward.

## 2014-10-09 NOTE — Progress Notes (Signed)
Patient ID: Renee GleeSarah E Mcclain, female   DOB: 02/23/1996, 18 y.o.   MRN: [id number] Client is 18 yo female admitted after overdosing on Klonopin (15 tabs). Client notes stressors as recent DUI,(her second one) relationship problems with BF. Client reports "I regret what I did, I wish I never did it, I want to get out of here, I was frustrated call my dad and told him to get me help or I'm swallowing these pills. He told me to grow up, so I swallowed the pills"  "I don't handle stress well, I start throwing up, and I can't eat"  Client reports she had a mental breakdown in July and [facility], notes that she went to see a doctor like her who was bipolar and was getting medication hoping the medication would make it all go away. Client also reports she does not want to be labeled as depressed and adamant about not taking any depression medications says she took some before that made her have a bad reaction, unsure but thinks it may have been Prozac" Client also reports she smokes THC "it helps calm me down" also says she drink wine occasionally. Client main focus is when will she be going home. One of client's goals is to gain coping skills "I never had any coping skills. Client has a medical history of Asthma. Client contracts for safety. Staff will monitor q7415min for safety.

## 2014-10-09 NOTE — Progress Notes (Signed)
.  Psychoeducational Group Note    Date: 10/09/2014 Time:  0930    Goal Setting Purpose of Group: To be able to set a goal that is measurable and that can be accomplished in one day  Participation Level:  Did not attend Shimshon Narula A  

## 2014-10-09 NOTE — Progress Notes (Signed)
D.  Pt pleasant but anxious on approach, denies complaints other than continued anxiety.  Denies SI/HI/hallucinations at this time.  Pt positive for evening AA group, interacting appropriately with peers on the unit.  A.  Support and encouragement offered  R.  Pt remains safe on unit, will continue to monitor.

## 2014-10-09 NOTE — BHH Counselor (Signed)
Adult Comprehensive Assessment  Patient ID: Renee Mcclain, female   DOB: 1996/02/10, 18 y.o.   MRN: [id number]  Information Source: Information source: Patient  Current Stressors:     Living/Environment/Situation:  Living Arrangements: Non-relatives/Friends Living conditions (as described by patient or guardian): safe, all needs met How long has patient lived in current situation?: 3 months  What is atmosphere in current home:  (comfortable)  Family History:  Marital status: Single (recent break-up ) Does patient have children?: No  Childhood History:  By whom was/is the patient raised?: Both parents Description of patient's relationship with caregiver when they were a child: fine Patient's description of current relationship with people who raised him/her: Pt's relationhip is good/stable Does patient have siblings?: Yes Number of Siblings: 1 Description of patient's current relationship with siblings: Pt has close relationship with him  Did patient suffer any verbal/emotional/physical/sexual abuse as a child?: No Did patient suffer from severe childhood neglect?: No Has patient ever been sexually abused/assaulted/raped as an adolescent or adult?: No Was the patient ever a victim of a crime or a disaster?: No Witnessed domestic violence?: No Has patient been effected by domestic violence as an adult?: No  Education:  Highest grade of school patient has completed: some college Currently a student?: Yes If yes, how has current illness impacted academic performance: Pt has auditory processing disorder Name of school: [employer] How long has the patient attended?: 1.5 years Learning disability?: No  Employment/Work Situation:   Employment situation: Employed Where is patient currently employed?: [employer] How long has patient been employed?: 4 years Patient's job has been impacted by current illness: No What is the longest time patient has a held a job?: 4 years Where  was the patient employed at that time?: [employer] Has patient ever been in the [employer]?: No Has patient ever served in Recruitment consultant?: No  Financial Resources:   Museum/gallery curator resources: Support from parents / caregiver, Income from employment Does patient have a Programmer, applications or guardian?: No  Alcohol/Substance Abuse:   What has been your use of drugs/alcohol within the last 12 months?: Pt reports that she drinks rarely; used to smoke but doens' tnow If attempted suicide, did drugs/alcohol play a role in this?: No Alcohol/Substance Abuse Treatment Hx: Denies past history Has alcohol/substance abuse ever caused legal problems?: Yes  Social Support System:   Patient's Community Support System: Good Describe Community Support System: family, good friends Type of faith/religion: n/a How does patient's faith help to cope with current illness?: n/a  Leisure/Recreation:   Leisure and Hobbies: Pt works, says nothing is fun   Strengths/Needs:   What things does the patient do well?: working In what areas does patient struggle / problems for patient: Pt unable to state  Discharge Plan:   Does patient have access to transportation?: Yes Will patient be returning to same living situation after discharge?:  (maybe; possibly back to moms) Currently receiving community mental health services: Yes (From Whom) (Dr. Kendrick Ranch is PCP and prescribes all medicine) If no, would patient like referral for services when discharged?: Yes (What county?) Does patient have financial barriers related to discharge medications?: No  Summary/Recommendations:   Patient is a 18 year old Caucasian female with a diagnosis of Bipolar affective disorder. Pt presented to the hospital after taking approximately 15 pills of a medication. Pt reports that she  Had gotten her second DUI in 6 months and felt like "nothing would get better" so she called her father and requested  that he come help her or she was "going to take  some pills." Pt reports that her father told her that "she was an adult and needed to suck it up." Subsequently, Pt took the pills and told her father the same. During assessment, Pt was distraught as she repeatedly stated that she "couldn't" stay here and that she needed her mother. Pt asked multiple times why she could not be discharged even after CSW explained hospital procedures and reasoning for her IVC status. Pt insisted that CSW go with Pt to speak with the doctor and despite redirection and further processing, Pt was insistent that CSW accompany her to talk to the doctor despite [place] her that writer could not persuade the doctor and that [place] felt Pt would benefit from continued hospitalization. Pt lives with a roommate but Pt reports she is willing to return home for further supervision. Pt reports she was seeing someone in the outpatient program at [facility] but "they weren't helping at all." Patient will benefit from crisis stabilization, medication evaluation, group therapy and psycho education in addition to case management for discharge planning.     Bo Mcclintock. 10/09/2014

## 2014-10-09 NOTE — Progress Notes (Signed)
Patient ID: Renee GleeSarah E Mcclain, female   DOB: 12/27/1995, 18 y.o.   MRN: [id number] CSW attempted to meet with Pt to complete PSA, however Pt was sleeping and would not respond to prompts by CSW to wake up, Pt snoring. CSW will follow-up.  Chad CordialLauren Carter, LCSWA 10/09/2014 9:59 AM

## 2014-10-09 NOTE — [facility] Suicide Risk Assessment (Signed)
   Nursing information obtained from:  Patient Demographic factors:  Caucasian, Living alone Current Mental Status:  Suicidal ideation indicated by patient Loss Factors:  Loss of significant relationship, Legal issues Historical Factors:  Prior suicide attempts Risk Reduction Factors:  Sense of responsibility to family, Positive therapeutic relationship Total Time spent with patient: 30 minutes  CLINICAL FACTORS:   Bipolar Disorder:   Mixed State  Psychiatric Specialty Exam: Physical Exam  ROS  Blood pressure 102/71, pulse 87, temperature 97.8 F (36.6 C), temperature source Oral, resp. rate 12, last menstrual period 09/30/2014.There is no weight on file to calculate BMI.  General Appearance: Disheveled  Eye Contact::  Minimal  Speech:  Normal Rate  Volume:  Normal  Mood:  Irritable  Affect:  Congruent  Thought Process:  Goal Directed  Orientation:  Full (Time, Place, and Person)  Thought Content:  Negative  Suicidal Thoughts:  No  Homicidal Thoughts:  No  Memory:  Immediate;   Fair Recent;   Fair Remote;   Fair  Judgement:  Poor  Insight:  Shallow  Psychomotor Activity:  Decreased  Concentration:  Fair  Recall:  [employer]Fair  Fund of Knowledge:Fair  Language: Fair  Akathisia:  Negative  Handed:  Right  AIMS (if indicated):     Assets:  Desire for Improvement Social Support  Sleep:  Number of Hours: 6.25   Musculoskeletal: Strength & Muscle Tone: within normal limits Gait & Station: normal Patient leans: N/A  COGNITIVE FEATURES THAT CONTRIBUTE TO RISK:  Closed-mindedness Loss of executive function Polarized thinking Thought constriction (tunnel vision)    SUICIDE RISK:  Extreme:  Frequent, intense, and enduring suicidal ideation, specific plans, clear subjective and objective intent, impaired self-control, severe dysphoria/symptomatology, many risk factors and no protective factors.  PLAN OF CARE: Pt is demanding to be discharged today, and minimizes her need to be  in the hospital. She does admit to overdosing on 15 klonopin tablets prior to admission, and that she was "hysterical" yesterday. Pt reports that she feels better today, and realizes she should not have overdosed on the pills. She states "I do not handle stress well". She does not want to be on the inpatient unit (because her parents cannot be with her here) and is asking to be transferred to a medical floor or ED, where her parents can be with her. Pt reports wanting to follow up with outpatient therapy. Pt was advised that her actions prior to admission were impulsive and serious, but pt continues to minimize her need to be in the hospital. Pt is currently IVC'ed, and will need to be observed further on the unit for safety/stabilization. Pt was encouraged to engage in milieu therapy and participate in groups (which pt has not been doing this morning).   I certify that inpatient services furnished can reasonably be expected to improve the patient's condition.  Ancil LinseySARANGA, Moe Brier 10/09/2014, 11:32 AM
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
## 2014-10-09 NOTE — Progress Notes (Signed)
Psychoeducational Group Note  Date: 10/09/2014 Time:  1315 Group Topic/Focus:  Identifying Needs:   The focus of this group is to help patients identify their personal needs that have been historically problematic and identify healthy behaviors to address their needs.  Participation Level:  Active  Participation Quality:  Appropriate  Affect:  Appropriate  Cognitive:  Oriented  Insight:  Improving  Engagement in Group:  Engaged  Additional Comments:  Pt was quite attentive and participated in the group.  Nyellie Yetter A 

## 2014-10-10 MED ORDER — BACITRACIN-NEOMYCIN-POLYMYXIN 400-5-5000 EX OINT
TOPICAL_OINTMENT | Freq: Two times a day (BID) | CUTANEOUS | Status: DC
  Administered 2014-10-10 – 2014-10-11 (×3): 1 via TOPICAL
  Administered 2014-10-12: 08:00:00 via TOPICAL
  Filled 2014-10-10 (×2): qty 1

## 2014-10-10 NOTE — [facility] Group Notes (Signed)
[facility] Group Notes:  Healthy Life Skills  Date:  10/10/2014  Time:  2:55 PM  Type of Therapy:  Nurse Education  Participation Level:  Active  Participation Quality:  Appropriate  Affect:  Appropriate  Cognitive:  Appropriate  Insight:  Appropriate  Engagement in Group:  Engaged  Modes of Intervention:  Discussion  Summary of Progress/Problems:  Renee Mcclain, Renee Mcclain 10/10/2014, 2:55 PM

## 2014-10-10 NOTE — Progress Notes (Signed)
D) Pt has been attending the groups and interacting with her peers appropriately. Pt is pleasant and participates in the activities of the unit. Pt received a lot of positive feed back for her sensitivity in creating a card for one of the other Pt's whose father died last night and organizing the Pt's to participate in the card making. Was able to accept positive feedback. Rates her depression at a 1, her hopelessness at a 1 and her anxiety at a 4. C/O of itching and discomfort of a tattoo that has been bothering her A) Given support, reassurance and praise along with encouragement. Provided with a 1:1. Provided with positive affirmations by her peers for her thoughtfulness. R) Denies SI and HI.

## 2014-10-10 NOTE — Progress Notes (Signed)
Patient attended AA meeting. Tonight we had a speaker meeting. They actively listened and colored. Pt was brighter this evening compared to last night. Her parents visited and she talked freely with her roommate. She interacted with her peers and hung out in the dayroom for the bulk of the evening.  Renee Mcclain, [name]

## 2014-10-10 NOTE — [facility] Group Notes (Signed)
[facility] Group Note: Relaxation  Date:  10/10/2014  Time:  11:51 AM  Type of Therapy:  Nurse Education  Participation Level:  Active  Participation Quality:  Appropriate  Affect:  Appropriate  Cognitive:  Appropriate  Insight:  Appropriate  Engagement in Group:  Engaged  Modes of Intervention:  Discussion  Summary of Progress/Problems:  Renee Mcclain, Renee Mcclain 10/10/2014, 11:51 AM

## 2014-10-10 NOTE — Progress Notes (Signed)
[facility]  10/10/2014 2:38 PM Renee Mcclain  MRN:  [id number] Subjective:  Patient was seen in her room after participating in group therapy.  Patient states that group therapy is helping her learn coping skills.  Patient states that she is really enjoying group therapy.  She regrets taking too much Klonopin and called herself"Dumb girl" for that.  She plans to move back home to her parents  until she is able to stay on her own again.  Patient reports good appetite and sleep.  She denies SI/HI/AVH.  She plans to continue group therapy after discharge instead of individual therapy.  She is expecting a visit from her parents.  We shall continue the plan of care we have, continue to encourage group therapy. Diagnosis:   DSM5: Schizophrenia Disorders:   Obsessive-Compulsive Disorders:   Trauma-Stressor Disorders:   Substance/Addictive Disorders:  Alcohol Related Disorder - Moderate (303.90) Depressive Disorders:  Major Depressive Disorder - Severe (296.23), BIPOLAR DISORDER MIXED,  Total Time spent with patient: 30 minutes   ADL's:  Intact  Sleep: Good  Appetite:  Good  Suicidal Ideation:  Denies Homicidal Ideation:  Denies AEB (as evidenced by):  Psychiatric Specialty Exam: Physical Exam  ROS  Blood pressure 112/66, pulse 81, temperature 98.6 F (37 C), temperature source Oral, resp. rate 16, last menstrual period 09/30/2014.There is no weight on file to calculate BMI.  General Appearance: Casual and Fairly Groomed  Patent attorneyye Contact::  Good  Speech:  Clear and Coherent and Normal Rate  Volume:  Normal  Mood:  Euthymic  Affect:  Congruent  Thought Process:  Coherent, Goal Directed and Intact  Orientation:  Full (Time, Place, and Person)  Thought Content:  WDL  Suicidal Thoughts:  No  Homicidal Thoughts:  No  Memory:  Immediate;   Good Recent;   Good Remote;   Good  Judgement:  Good  Insight:  Good  Psychomotor Activity:  Normal  Concentration:  Good  Recall:  NA   Fund of Knowledge:Good  Language: Good  Akathisia:  NA  Handed:  Right  AIMS (if indicated):     Assets:  Desire for Improvement  Sleep:  Number of Hours: 5.5   Musculoskeletal: Strength & Muscle Tone: within normal limits Gait & Station: normal Patient leans: N/A  Current Medications: Current Facility-Administered Medications  Medication Dose Route Frequency Provider Last Rate Last Dose  . acetaminophen (TYLENOL) tablet 650 mg  650 mg Oral Q6H PRN Verne SpurrNeil Mashburn, PA-C      . albuterol (PROVENTIL HFA;VENTOLIN HFA) 108 (90 BASE) MCG/ACT inhaler 2 puff  2 puff Inhalation Q6H PRN Verne SpurrNeil Mashburn, PA-C      . alum & mag hydroxide-simeth (MAALOX/MYLANTA) 200-200-20 MG/5ML suspension 30 mL  30 mL Oral Q4H PRN Verne SpurrNeil Mashburn, PA-C      . ARIPiprazole (ABILIFY) tablet 5 mg  5 mg Oral QHS Verne SpurrNeil Mashburn, PA-C   5 mg at 10/09/14 2113  . chlordiazePOXIDE (LIBRIUM) capsule 25 mg  25 mg Oral Q6H PRN Sanjuana KavaAgnes I Nwoko, NP      . chlordiazePOXIDE (LIBRIUM) capsule 25 mg  25 mg Oral TID Sanjuana KavaAgnes I Nwoko, NP   25 mg at 10/10/14 1251   Followed by  . [START ON 10/11/2014] chlordiazePOXIDE (LIBRIUM) capsule 25 mg  25 mg Oral BH-qamhs Sanjuana KavaAgnes I Nwoko, NP       Followed by  . [START ON 10/13/2014] chlordiazePOXIDE (LIBRIUM) capsule 25 mg  25 mg Oral Daily Sanjuana KavaAgnes I Nwoko, NP      .  desogestrel-ethinyl estradiol (KARIVA,AZURETTE,MIRCETTE) 0.15-0.02/0.01 MG (21/5) per tablet 1 tablet  1 tablet Oral Daily Craige CottaFernando A Cobos, MD   1 tablet at 10/09/14 0800  . hydrOXYzine (ATARAX/VISTARIL) tablet 25 mg  25 mg Oral Q6H PRN Sanjuana KavaAgnes I Nwoko, NP   25 mg at 10/09/14 2000  . hydrOXYzine (ATARAX/VISTARIL) tablet 50 mg  50 mg Oral QHS Verne SpurrNeil Mashburn, PA-C   50 mg at 10/09/14 2113  . loperamide (IMODIUM) capsule 2-4 mg  2-4 mg Oral PRN Sanjuana KavaAgnes I Nwoko, NP      . magnesium hydroxide (MILK OF MAGNESIA) suspension 30 mL  30 mL Oral Daily PRN Verne SpurrNeil Mashburn, PA-C      . multivitamin with minerals tablet 1 tablet  1 tablet Oral Daily Sanjuana KavaAgnes I Nwoko,  NP   1 tablet at 10/10/14 0800  . neomycin-bacitracin-polymyxin (NEOSPORIN) ointment   Topical BID Sanjuana KavaAgnes I Nwoko, NP   1 application at 10/10/14 1307  . ondansetron (ZOFRAN-ODT) disintegrating tablet 4 mg  4 mg Oral Q6H PRN Sanjuana KavaAgnes I Nwoko, NP      . thiamine (B-1) injection 100 mg  100 mg Intramuscular Once Sanjuana KavaAgnes I Nwoko, NP   100 mg at 10/09/14 1130  . thiamine (VITAMIN B-1) tablet 100 mg  100 mg Oral Daily Sanjuana KavaAgnes I Nwoko, NP   100 mg at 10/10/14 0800    Lab Results: No results found for this or any previous visit (from the past 48 hour(s)).  Physical Findings: AIMS: Facial and Oral Movements Muscles of Facial Expression: None, normal Lips and Perioral Area: None, normal Jaw: None, normal Tongue: None, normal,Extremity Movements Upper (arms, wrists, hands, fingers): None, normal Lower (legs, knees, ankles, toes): None, normal, Trunk Movements Neck, shoulders, hips: None, normal, Overall Severity Severity of abnormal movements (highest score from questions above): None, normal Incapacitation due to abnormal movements: None, normal Patient's awareness of abnormal movements (rate only patient's report): No Awareness, Dental Status Current problems with teeth and/or dentures?: No Does patient usually wear dentures?: No  CIWA:  CIWA-Ar Total: 1 COWS:     Treatment Plan Summary: Daily contact with patient to assess and evaluate symptoms and progress in treatment Medication management  Plan: Continue with plan of care Continue crisis management Encourage to participate in group and individual sessions Continue medication management/ and review as needed Continue to offer Librium for alcohol detox per our protocol Hydroxyzine 25 mg by mouth every 6 hours as needed for anxiety Hydroxyzine 50 mg by mouth at bed time for sleep Aripiprazole 5 mg by mouth for mood enhancement. Discharge  Plan in progress Address health issues /V/S as needed    Medical Decision Making Problem Points:   Established problem, stable/improving (1) Data Points:  Review and summation of old records (2) Review of medication regiment & side effects (2)  I certify that inpatient services furnished can reasonably be expected to improve the patient's condition.   Dahlia ByesONUOHA, Colbin Jovel, C  PMHNP-BC 10/10/2014, 2:38 PM

## 2014-10-10 NOTE — [facility] Group Notes (Signed)
[facility] LCSW Group Therapy 10/10/2014 10:00am  Type of Therapy: Group Therapy- Feelings Around Discharge & Establishing a Supportive Framework  Participation Level: Active   Modes of Intervention: Clarification, Confrontation, Discussion, Education, Exploration, Limit-setting, Orientation, Problem-solving, Rapport Building, Dance movement psychotherapisteality Testing, Socialization and Support   Description of Group:   What is a supportive framework? What does it look like feel like and how do I discern it from and unhealthy non-supportive network? Learn how to cope when supports are not helpful and don't support you. Discuss what to do when your family/friends are not supportive.  Summary of Patient Progress Pt's affect was observed to be brighter today and Pt reported an improved mood. Pt was able to verbalize characteristics of positive and healthy relationships, specifically unconditional love and accountability as necessary attributes of a positive support. Pt identified her parents and younger brother as people who demonstrate those characteristics.   Therapeutic Modalities:   Cognitive Behavioral Therapy Person-Centered Therapy Motivational Interviewing   Renee CordialLauren Carter, LCSWA 10/10/2014 11:05 AM

## 2014-10-11 DIAGNOSIS — F332 Major depressive disorder, recurrent severe without psychotic features: Secondary | ICD-10-CM

## 2014-10-11 DIAGNOSIS — F1010 Alcohol abuse, uncomplicated: Secondary | ICD-10-CM

## 2014-10-11 NOTE — Progress Notes (Signed)
Patient ID: Renee Mcclain, female   DOB: [birth year], 18 y.o.   MRN: [id number] [facility] MD Progress Note  10/11/2014 1:30 PM Renee Mcclain  MRN:  [id number] Subjective: Patient states she is feeling  Better than upon admission and is hoping for discharge soon. She is denying any side effects from medications. Objective: I have discussed case with treatment team and have met with patient. 18 year old single woman , admitted following an impulsive overdose on klonopin. She has a history of Mood Disorder, and states she has been diagnosed with Bipolar Spectrum Disorder. She states she has had several recent stressors, to include recently breaking up with boyfriend, feeling she disappointed her parents , and recent DUI charge. She admits she had been drinking that day but is currently denying any pattern of regular/heavy alcohol consumption. At this time patient reports improvement, and states she is feeling better. No longer feels depressed. She is denying any SI. She does remain somewhat labile, and was briefly tearful at times today, particularly when discussing her recent stressors and her wanting to be discharged " so I can get back to my life". At present she is on Abilify which she is tolerating well. Denies side effects and there is no [place]. She states she has a history of increased suicidal ideations and activation on an antidepressant medication in the past but does not remember name. We discussed possibility of adding lamictal to medication management schedule, but she prefers to continue on Abilify as monotherapy at this time. No disruptive behaviors on unit. Going to groups and visible in milieu.  Diagnosis:   DSM5: Schizophrenia Disorders:   Obsessive-Compulsive Disorders:   Trauma-Stressor Disorders:   Substance/Addictive Disorders:  Alcohol Related Disorder - Moderate (303.90) Depressive Disorders:  Major Depressive Disorder - Severe (296.23), BIPOLAR DISORDER MIXED,  Total Time  spent with patient: 25 minutes    ADL's:  Intact  Sleep: Good  Appetite:  Good  Suicidal Ideation:  Denies Homicidal Ideation:  Denies AEB (as evidenced by):  Psychiatric Specialty Exam: Physical Exam  ROS  Blood pressure 110/74, pulse 87, temperature 97.7 F (36.5 C), temperature source Oral, resp. rate 14, last menstrual period 09/30/2014.There is no weight on file to calculate BMI.  General Appearance: Casual and Fairly Groomed  Eye Contact::  Good  Speech:  Clear and Coherent and Normal Rate  Volume:  Normal  Mood:  reports mood as improved, denies depression at present   Affect:  improved but remains labile  Thought Process:  Coherent, Goal Directed and Intact  Orientation:  Full (Time, Place, and Person)  Thought Content:  no hallucinations, no delusions   Suicidal Thoughts:  No at this time denies any thoughts of hurting self and  contracts for safety on unit   Homicidal Thoughts:  No  Memory:  Recent and Remote grossly intact  Judgement:  Good  Insight:  Good  Psychomotor Activity:  Normal  Concentration:  Good  Recall:  NA  Fund of Knowledge:Good  Language: Good  Akathisia:  NA  Handed:  Right  AIMS (if indicated):     Assets:  Desire for Improvement  Sleep:  Number of Hours: 5.75   Musculoskeletal: Strength & Muscle Tone: within normal limits- no distal tremors , no diaphoresis, no psychomotor restlessness.  Gait & Station: normal Patient leans: N/A  Current Medications: Current Facility-Administered Medications  Medication Dose Route Frequency Provider Last Rate Last Dose  . acetaminophen (TYLENOL) tablet 650 mg  650 mg Oral Q6H PRN  Nena Polio, PA-C      . albuterol (PROVENTIL HFA;VENTOLIN HFA) 108 (90 BASE) MCG/ACT inhaler 2 puff  2 puff Inhalation Q6H PRN Nena Polio, PA-C      . alum & mag hydroxide-simeth (MAALOX/MYLANTA) 200-200-20 MG/5ML suspension 30 mL  30 mL Oral Q4H PRN Nena Polio, PA-C      . ARIPiprazole (ABILIFY) tablet 5 mg  5  mg Oral QHS Nena Polio, PA-C   5 mg at 10/10/14 2123  . chlordiazePOXIDE (LIBRIUM) capsule 25 mg  25 mg Oral Q6H PRN Encarnacion Slates, NP   25 mg at 10/10/14 2123  . chlordiazePOXIDE (LIBRIUM) capsule 25 mg  25 mg Oral BH-qamhs Encarnacion Slates, NP       Followed by  . [START ON 10/13/2014] chlordiazePOXIDE (LIBRIUM) capsule 25 mg  25 mg Oral Daily Encarnacion Slates, NP      . desogestrel-ethinyl estradiol (KARIVA,AZURETTE,MIRCETTE) 0.15-0.02/0.01 MG (21/5) per tablet 1 tablet  1 tablet Oral Daily Jenne Campus, MD   1 tablet at 10/09/14 0800  . hydrOXYzine (ATARAX/VISTARIL) tablet 25 mg  25 mg Oral Q6H PRN Encarnacion Slates, NP   25 mg at 10/10/14 2252  . hydrOXYzine (ATARAX/VISTARIL) tablet 50 mg  50 mg Oral QHS Nena Polio, PA-C   50 mg at 10/10/14 2123  . loperamide (IMODIUM) capsule 2-4 mg  2-4 mg Oral PRN Encarnacion Slates, NP      . magnesium hydroxide (MILK OF MAGNESIA) suspension 30 mL  30 mL Oral Daily PRN Nena Polio, PA-C      . multivitamin with minerals tablet 1 tablet  1 tablet Oral Daily Encarnacion Slates, NP   1 tablet at 10/11/14 6712  . neomycin-bacitracin-polymyxin (NEOSPORIN) ointment   Topical BID Encarnacion Slates, NP   1 application at 45/80/99 [phone]  . ondansetron (ZOFRAN-ODT) disintegrating tablet 4 mg  4 mg Oral Q6H PRN Encarnacion Slates, NP      . thiamine (B-1) injection 100 mg  100 mg Intramuscular Once Encarnacion Slates, NP   100 mg at 10/09/14 1130  . thiamine (VITAMIN B-1) tablet 100 mg  100 mg Oral Daily Encarnacion Slates, NP   100 mg at 10/11/14 2505    Lab Results: No results found for this or any previous visit (from the past 48 hour(s)).  Physical Findings: AIMS: Facial and Oral Movements Muscles of Facial Expression: None, normal Lips and Perioral Area: None, normal Jaw: None, normal Tongue: None, normal,Extremity Movements Upper (arms, wrists, hands, fingers): None, normal Lower (legs, knees, ankles, toes): None, normal, Trunk Movements Neck, shoulders, hips: None, normal,  Overall Severity Severity of abnormal movements (highest score from questions above): None, normal Incapacitation due to abnormal movements: None, normal Patient's awareness of abnormal movements (rate only patient's report): No Awareness, Dental Status Current problems with teeth and/or dentures?: No Does patient usually wear dentures?: No  CIWA:  CIWA-Ar Total: 1 COWS:      Assessment: Patient is improved compared to admission- at this time minimizes depression or neurovegetative symptoms. She is not suicidal. She does present labile in affect. She is tolerating Abilify well. There are no current symptoms of ETOH withdrawal and vitals are stable.    Treatment Plan Summary: Daily contact with patient to assess and evaluate symptoms and progress in treatment Medication management  Plan: Continue inpatient treatment and support  Consider discharge soon as she  Continues to stabilize  Hydroxyzine 25 mg  Q 6 hours PRN A nxiety Aripiprazole  5 mg  QDAY  Patient interested in IOP/PHP program as follow up.     Medical Decision Making Problem Points:  Established problem, stable/improving (1), Review of last therapy session (1) and Review of psycho-social stressors (1) Data Points:  Review of medication regiment & side effects (2)  I certify that inpatient services furnished can reasonably be expected to improve the patient's condition.   Renee Mcclain   10/11/2014, 1:30 PM

## 2014-10-11 NOTE — [facility] Group Notes (Addendum)
[facility] LCSW Group Therapy          Overcoming Obstacles       1:15 -2:30        10/11/2014   3:32 PM     Type of Therapy:  Group Therapy  Participation Level:  Appropriate  Participation Quality:  Appropriate  Affect:  Appropriate, Alert  Cognitive:  Appropriate  Insight: Developing/Improving Engaged  Engagement in Therapy: Developing/Imprvoing Engaged  Modes of Intervention:  Discussion Exploration  Education Rapport BuildingProblem-Solving Support  Summary of Progress/Problems:  The main focus of today's group was overcoming obstacles.  She advised the obstacle she has to overcome is herself.  Patient shared she tends to overanalyze thing and that makes her more upset.   Patient able to identify appropriate coping skills.   Wynn BankerHodnett, Adalai Perl Hairston 10/11/2014   3:32 PM

## 2014-10-11 NOTE — [facility] Suicide Risk Assessment (Signed)
[facility] INPATIENT:  Family/Significant Other Suicide Prevention Education  Suicide Prevention Education:  Education Completed; Renee Mcclain, Mother [phone];  has been identified by the patient as the family member/significant other with whom the patient will be residing, and identified as the person(s) who will aid the patient in the event of a mental health crisis (suicidal ideations/suicide attempt).  With written consent from the patient, the family member/significant other has been provided the following suicide prevention education, prior to the and/or following the discharge of the patient.  The suicide prevention education provided includes the following:  Suicide risk factors  Suicide prevention and interventions  National Suicide Hotline telephone number  [facility] assessment telephone number  [facility] Emergency Assistance 911  [facility] and/or [facility] telephone number  Request made of family/significant other to:  Remove weapons (e.g., guns, rifles, knives), all items previously/currently identified as safety concern.  Mother advised patient does not have access to weapons.     Remove drugs/medications (over-the-counter, prescriptions, illicit drugs), all items previously/currently identified as a safety concern.  The family member/significant other verbalizes understanding of the suicide prevention education information provided.  The family member/significant other agrees to remove the items of safety concern listed above.  Renee Mcclain, Renee Mcclain 10/11/2014, 1:04 PM

## 2014-10-11 NOTE — Progress Notes (Signed)
D: Patient in the hallway on approach.  Patient states she is excited because she will be able to be discharged tomorrow.  Patient state she has realized that she is not alone and states that she is willing to get help for her depression and anxiety.  Patient states her goal for today was to work on her discharge.  Patient denies SI/HI and denies AVH.  Patient verbally contracts for safety. A: Staff to monitor Q 15 mins for safety.  Encouragement and support offered.  Scheduled medications administered per orders.  Vistaril administered prn for anxiety R: Patient remains safe on the unit.  Patient attended group tonight.  Patient visible on the unit and interacting with peers.  Patient taking administered medications.

## 2014-10-11 NOTE — Progress Notes (Signed)
Pt has been up for groups and interacting with her peers and staff appropriately. She denied any depression or hopelessness but admitted to anxiety a 1 on her self-inventory. She denied any S/H ideation or A/V/H.  She denies any physically complaints.  She is hoping to discharge and f/u IOP downstairs.

## 2014-10-11 NOTE — Progress Notes (Signed)
Nutrition Brief Note  Patient identified on the Malnutrition Screening Tool (MST) Report  Pt admitted with bipolar disorder and anxiety.  Pt admits to weight loss but states that she has stomach issues which the doctor told her was a stomach virus. Pt reports appetite is good and she is eating well now.  Discussed with pt the importance of eating 3 meals a day with snacks, emphasizing protein consumption.   Wt Readings from Last 15 Encounters:  06/21/14 178 lb (80.74 kg) (94 %*, Z = 1.57)  06/08/14 177 lb (80.287 kg) (94 %*, Z = 1.55)  02/23/14 181 lb (82.101 kg) (95 %*, Z = 1.64)  02/16/13 174 lb (78.926 kg) (94 %*, Z = 1.57)  10/25/11 153 lb (69.4 kg) (88 %*, Z = 1.17)   * Growth percentiles are based on CDC 2-20 Years data.    BMI: 27.1  Patient meets criteria for overweight based on current BMI.   Diet Order:   Pt is also offered choice of unit snacks mid-morning and mid-afternoon.  Pt is eating as desired.  Labs and medications reviewed.   No nutrition interventions warranted at this time. If nutrition issues arise, please consult RD.   Tilda FrancoLindsey Mahamed Zalewski, MS, RD, LDN Pager: [phone] After Hours Pager: [phone]

## 2014-10-11 NOTE — Progress Notes (Signed)
The patient attended group last evening and was appropriate.  

## 2014-10-11 NOTE — [facility] Group Notes (Signed)
Adult Psychoeducational Group Note  Date:  10/11/2014 Time:  11:51 PM  Group Topic/Focus:  Wrap-Up Group:   The focus of this group is to help patients review their daily goal of treatment and discuss progress on daily workbooks.  Participation Level:  Active  Participation Quality:  Appropriate  Affect:  Appropriate  Cognitive:  Appropriate  Insight: Appropriate  Engagement in Group:  Engaged  Modes of Intervention:  Discussion  Additional Comments:  [name] stated her day was good.  She discharges tomorrow.  Her goal was to figure out when she was going home.  Her coping skill is to talk to her mother.  [name], [name] 10/11/2014, 11:51 PM

## 2014-10-11 NOTE — Plan of Care (Signed)
Problem: Alteration in mood; excessive anxiety as evidenced by: Goal: LTG-Patient's behavior demonstrates decreased anxiety Goal met. Patient is rating anxiety at one. Goal is for patient to rate anxiety at three or below on discharge.   Renee Mcclain  [phone] 10/11/2014 10:32 AM  (Patient's behavior demonstrates anxiety and he/she is utilizing learned coping skills to deal with anxiety-producing situations)  Outcome: Completed/Met Date Met:  10/11/14

## 2014-10-11 NOTE — Progress Notes (Signed)
D.  Pt pleasant on approach, positive for evening AA group.  Interacting appropriately with peers on unit.  No complaints voiced, did request medication for sleep.  Pt would like to sign in voluntarily.  Hoping for discharge tomorrow.  Denies SI/HI/hallucinations at this time.  A.  Support and encouragement offered  R.  Pt remains safe on unit, will continue to monitor.

## 2014-10-11 NOTE — Plan of Care (Signed)
Problem: Diagnosis: Increased Risk For Suicide Attempt Goal: STG-Patient Will Report Suicidal Feelings to Staff Outcome: Completed/Met Date Met:  10/11/14 Pt denies any S/I and plans to leave later today.

## 2014-10-11 NOTE — [facility] Group Notes (Signed)
[facility]   10/11/2014 10:33 AM    Participation Quality:  Appropraite  Mood/Affect:  Appropriate  Depression Rating:  0  Anxiety Rating:  1  Thoughts of Suicide:  No  Will you contract for safety?   NA  Current AVH:  No  Plan for Discharge/Comments:  Patient attended discharge planning group and actively participated in group. She reports feeling much better and hopes to discharge soon.  [facility]  Suicide prevention education reviewed and SPE document provided.   Transportation Means: Patient has transportation.   Supports:  Patient has a support system.   Renee Mcclain, Renee Mcclain

## 2014-10-11 NOTE — Plan of Care (Signed)
Problem: Alteration in mood Goal: LTG-Pt's behavior demonstrates decreased signs of depression Goal met. Patient is rating depression at one. Goal is for depression to be rated at three or below on discharge.  Burnis Medin Tyrika Newman, LCSW 10:30 AM    (Patient's behavior demonstrates decreased signs of depression to the point the patient is safe to return home and continue treatment in an outpatient setting)  Outcome: Completed/Met Date Met:  10/11/14

## 2014-10-12 ENCOUNTER — Encounter (HOSPITAL_COMMUNITY): Payer: Self-pay

## 2014-10-12 ENCOUNTER — Other Ambulatory Visit (HOSPITAL_COMMUNITY): Payer: BC Managed Care – PPO | Admitting: Licensed Clinical Social Worker

## 2014-10-12 DIAGNOSIS — F3162 Bipolar disorder, current episode mixed, moderate: Secondary | ICD-10-CM | POA: Insufficient documentation

## 2014-10-12 DIAGNOSIS — F319 Bipolar disorder, unspecified: Secondary | ICD-10-CM | POA: Diagnosis present

## 2014-10-12 MED ORDER — ARIPIPRAZOLE 5 MG PO TABS: 5.0000 mg | ORAL_TABLET | Freq: Every day | ORAL | Status: DC

## 2014-10-12 MED ORDER — HYDROXYZINE HCL 25 MG PO TABS
25.0000 mg | ORAL_TABLET | Freq: Three times a day (TID) | ORAL | Status: DC | PRN
Start: 2014-10-12 — End: 2014-10-12
  Filled 2014-10-12: qty 9

## 2014-10-12 MED ORDER — DESOGESTREL-ETHINYL ESTRADIOL 0.15-0.02/0.01 MG (21/5) PO TABS: 1.0000 | ORAL_TABLET | Freq: Every day | ORAL | Status: AC

## 2014-10-12 MED ORDER — ADULT MULTIVITAMIN W/MINERALS CH: 1.0000 | ORAL_TABLET | Freq: Every day | ORAL | Status: AC

## 2014-10-12 MED ORDER — HYDROXYZINE HCL 50 MG PO TABS: 50.0000 mg | ORAL_TABLET | Freq: Every day | ORAL | Status: AC

## 2014-10-12 NOTE — [facility] Group Notes (Signed)
[facility] Group Notes:  (Nursing/MHT/Case Management/Adjunct)  Date:  10/12/2014  Time:  9:54 AM  Type of Therapy:  Nurse Education  Participation Level:  Active  Participation Quality:  Appropriate  Affect:  Blunted  Cognitive:  Appropriate  Insight:  Lacking  Engagement in Group:  Engaged  Modes of Intervention:  Discussion, Education, Exploration and Orientation  Summary of Progress/Problems: Pt identified coloring in adult coloring books, "like the [place]" as a coping mechanism. Pt will determine a daily goal for the day.   Renee Mcclain, Renee Mcclain 10/12/2014, 9:54 AM

## 2014-10-12 NOTE — Progress Notes (Signed)
Discharge Note:  Patient discharged.  [facility].  Patient denied SI and HI.  Denied A/V hallucinations.  Denied pain.  Suicide prevention information given and discussed with patient who stated she understood and had no questions.  Patient stated she received all her belongings, clothing, toiletries, misc items, prescriptions.  Patient stated she appreciated all [facility]. '

## 2014-10-12 NOTE — Psych (Signed)
[facility]  Date: 10/12/2014 Name: Renee Mcclain MRN: [id number]  Chief Complaint: Bipolar, impulsivity because feeling hopeless led to OD attempt.   Subjective: "I felt so hopeless wanted to die" She relates that it was an impulsive at the time   HPI: Patient is Mcclain 18 y.o. Caucasian female presents with anxiety, managing impulsivity, and managing stress.  Patient was enrolled in partial psychiatric program on 10/12/2014.  Primary complaints include: anxiety, anxiety attacks, and legal problem.  Onset of symptoms was abrupt and 5 days ago with rapidly worsening course since that time. Psychosocial Stressors include the following: family(they are supportive but feels bad when she lets them down), financial, legal and drug and alcohol.   I have reviewed the following documentation dated 10/12/14: past psychiatric history, past medical history and past social and family history  Complaints of Pain: none Past Psychiatric History:  Past psychiatric hospitalizations 1x recently, 1x ED- Drug/alcohol previous marijuana use but denies use now, Therapy-Out Patient when younger briefly. She said diagnosed with ADHD but misdiagnosed. It was an auditory processing disorder  Currently in treatment with PHP.  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 Tympanostomy    . Dental surgery      Past Medical History  Diagnosis Date  . Asthma     "grew out of it around 18 years of age"  . Acne   . Menorrhagia   . Auditory processing disorder 2014   Outpatient Encounter Prescriptions as of 10/12/2014  Medication Sig Note  . albuterol (PROVENTIL HFA;VENTOLIN HFA) 108 (90 BASE) MCG/ACT inhaler Inhale 1 puff into the lungs every 6 (six) hours as needed for  wheezing or shortness of breath (shortness of breath).   . ARIPiprazole (ABILIFY) 5 MG tablet Take 1 tablet (5 mg total) by mouth at bedtime.   Marland Kitchen. desogestrel-ethinyl estradiol (KARIVA,AZURETTE,MIRCETTE) 0.15-0.02/0.01 MG (21/5) tablet Take 1 tablet by mouth daily.   . hydrOXYzine (ATARAX/VISTARIL) 50 MG tablet Take 1 tablet (50 mg total) by mouth at bedtime.   . hydrOXYzine (ATARAX/VISTARIL) 25 MG tablet Take 1 tablet (25 mg total) by mouth 3 (three) times daily as needed. (Patient not taking: Reported on 10/12/2014)   . Multiple Vitamin (MULTIVITAMIN WITH MINERALS) TABS tablet Take 1 tablet by mouth daily. (Patient not taking: Reported on 10/12/2014)   . ondansetron (ZOFRAN) 4 MG tablet Take 4 mg by mouth every 4 (four) hours as needed for nausea or vomiting (nausea).   . [DISCONTINUED] ARIPiprazole (ABILIFY) 5 MG tablet Take 1 tablet (5 mg total) by mouth daily. (Patient not taking: Reported on 10/12/2014)   . [DISCONTINUED] clonazePAM (KLONOPIN) 0.5 MG tablet Take 0.5 mg by mouth 2 (two) times daily. 10/08/2014: Pt stated she took 15 tablets this morning.  . [DISCONTINUED] desogestrel-ethinyl estradiol (KARIVA,AZURETTE,MIRCETTE) 0.15-0.02/0.01 MG (21/5) tablet Take 1 tablet by mouth daily.   . [DISCONTINUED] etonogestrel-ethinyl estradiol (NUVARING) 0.12-0.015 MG/24HR vaginal ring Insert vaginally and leave in place for 4 consecutive weeks, then remove for 3d (Patient not taking: Reported on 10/08/2014)    Allergies  Allergen Reactions  . Azithromycin Hives    History  Substance Use Topics  . Smoking status: Never Smoker   . Smokeless tobacco: Never Used  . Alcohol Use: 0.6 oz/week    1 Glasses of wine per  week   Functioning Relationships: good support system, gets along well with co-workers and good relationship with parents Education: College       Please specify degree: 1 semester sophomore year [employer] identified as Mcclain goal transfering to [employer] possibly.  Other Pertinent History: Arrests-1X,  Financial, [place] and 1 possession for marijuana, Trauma-Grandmother passed last October. She is doing but upset her. Family History  Problem Relation Age of Onset  . Heart disease Maternal Grandfather   . Hypertension Paternal Grandfather   . Cancer Maternal Grandmother     pancreatic  . Bipolar disorder Maternal Uncle   . Insomnia Maternal Uncle   . Bipolar disorder Paternal Grandmother   . Insomnia Paternal Grandmother        Objective:  There were no vitals filed for this visit.  Physical Exam: No exam performed today, no exam necessary.  Mental Status Exam: Appearance:  Casually dressed Psychomotor::  Within Normal Limits Attention span and concentration: Normal Behavior: calm and cooperative Speech:  normal pitch and normal volume Mood:  neutral Affect:  normal and mood-congruent Thought Process:  within normal limits Thought Content:  not suicidal Orientation:  person, place and time/date Cognition:  appropriate Insight:  Intact Judgment:  Intact Estimate of Intelligence: Average Fund of knowledge: Intact Memory: Recent and remote intact Abnormal movements: None Gait and station: Normal  Assessment:  Diagnosis: Primary Diagnosis: Bipolar 1 disorder, mixed, moderate [F31.62] 1. Bipolar 1 disorder, mixed, moderate     Indications for admission: inpatient care required if not in partial hospital program. Client needs to work on impulsive behaviors in time of stress.  Plan: 1.Come to PHP.2.Compliant with meds.3.Call and report if any mental health symptoms and problems with medications.4.Begin to work on Optician, dispensingstress management and maintaining Mcclain healthy lifestyle.   Treatment options and alternatives reviewed with patient and patient understands the above plan.   Comments: .    Renee Mcclain

## 2014-10-12 NOTE — Tx Team (Signed)
Interdisciplinary Treatment Plan Update   Date Reviewed:  10/12/2014  Time Reviewed:  8:57 AM  Progress in Treatment:   Attending groups: Yes Participating in groups: Yes Taking medication as prescribed: Yes  Tolerating medication: Yes Family/Significant other contact made:  Yes, collateral contact with mother. Patient understands diagnosis: Yes, patient understands diagnosis and need for treatment. Discussing patient identified problems/goals with staff: Yes, patient is able to express goals for treatment and discharge. Medical problems stabilized or resolved: Yes Denies suicidal/homicidal ideation: Yes Patient has not harmed self or others: Yes  For review of initial/current patient goals, please see plan of care.  Estimated Length of Stay:  Discharge today  Reasons for Continued Hospitalization:   New Problems/Goals identified:    Discharge Plan or Barriers:   Home [facility]  Additional Comments:  Patient and CSW reviewed patient's identified goals and treatment plan.  Patient verbalized understanding and agreed to treatment plan.   Attendees:  Patient:  10/12/2014 8:57 AM   Signature:  Sallyanne HaversF. Cobos, MD 10/12/2014 8:57 AM  Signature: Geoffery LyonsIrving Lugo, MD 10/12/2014 8:57 AM  Signature: Fransisca KaufmannLaura Davis, NP  10/12/2014 8:57 AM  Signature: Quintella ReichertBeverly Knight, RN 10/12/2014 8:57 AM  Signature:  Serena ColonelAggie Nwoko 10/12/2014 8:57 AM  Signature:  Juline PatchQuylle Annalisia Ingber, LCSW 10/12/2014 8:57 AM  Signature:   10/12/2014 8:57 AM  Signature:  Onnie BoerJennifer Clark, RN [facility] 10/12/2014 8:57 AM  Signature:   10/12/2014 8:57 AM  Signature:  10/12/2014  8:57 AM  Signature:    10/12/2014  8:57 AM  Signature:  10/12/2014  8:57 AM    Scribe for Treatment Team:   Juline PatchQuylle Shiya Fogelman,  10/12/2014 8:57 AM

## 2014-10-12 NOTE — Progress Notes (Signed)
[facility] :  Will you be returning to the same living situation after discharge: Yes,  Patient is returning to her home. At discharge, do you have transportation home?:Yes,  Patient to arrange transportation home. Do you have the ability to pay for your medications:Yes,  Patient can afford to get medications.  Release of information consent forms completed and in the chart;  Patient's signature needed at discharge.  Patient to Follow up at: Follow-up Information    Follow up with Behavioral Health Outpatient Partial Program On 10/12/2014.   Why:  Tuesday, October 12, 2014 at [facility]11AM   Contact information:   [address] [place], [place]   [address]  [phone]      Patient denies SI/HI: Patient no longer endorsing SI/HI or other thoughts of self harm.  Safety Planning and Suicide Prevention discussed: .Reviewed with all patients during discharge planning group   Renee Mcclain, Renee Mcclain 10/12/2014, 8:59 AM

## 2014-10-12 NOTE — [facility] Suicide Risk Assessment (Signed)
Demographic Factors:  18 year old single female, no children, college student  Total Time spent with patient: 30 minutes  Psychiatric Specialty Exam: Physical Exam  ROS  Blood pressure 129/68, pulse 75, temperature 98.4 F (36.9 C), temperature source Oral, resp. rate 18, last menstrual period 09/30/2014.There is no weight on file to calculate BMI.  General Appearance: improved grooming   Eye Contact::  Good  Speech:  Normal Rate  Volume:  Normal  Mood:  Depressed  Affect:  Full Range- no longer labile nor tearful  Thought Process:  Goal Directed and Linear  Orientation:  Full (Time, Place, and Person)  Thought Content:  no hallucinations, no delusions  Suicidal Thoughts:  No  Homicidal Thoughts:  No  Memory:  Recent and Remote grossly intact  Judgement:  Other:  improved   Insight:  Present  Psychomotor Activity:  Normal- no agitation or restlessness   Concentration:  Good  Recall:  Good  Fund of Knowledge:Good  Language: Good  Akathisia:  Negative  Handed:  Right  AIMS (if indicated):     Assets:  Communication Skills Desire for Improvement Physical Health Resilience Social Support  Sleep:  Number of Hours: 6.5    Musculoskeletal: Strength & Muscle Tone: within normal limits Gait & Station: normal Patient leans: N/A   Mental Status Per Nursing Assessment::   On Admission:  Suicidal ideation indicated by patient  Current Mental Status by Physician: At this time patient is improved, with a full range of affect. Mood is euthymic, no thought disorder, no SI or HI, no psychotic symptoms, future oriented. No symptoms of WDL and as noted patient denies any pattern of regular alcohol consumption. Of note, states that she plans to abstain completely from alcohol, as encouraged.   Loss Factors: Recent DUI, break up with boyfriend, some family stress   Historical Factors: No prior history of suicidal gestures, no history of cutting, this is her first psychiatric  admission.  Risk Reduction Factors:   Sense of responsibility to family, Living with another person, especially a relative and Positive coping skills or problem solving skills  Continued Clinical Symptoms:  As noted, currently much improved, euthymic, full range of affect. No SI or HI. Tolerating Abilify well , with no side effects. No akatisia or abnormal involuntary movements noted. We reviewed side effect profile.   Cognitive Features That Contribute To Risk:  No gross cognitive deficits noted upon discharge. Is alert , attentive, and oriented x 3   Suicide Risk:  Minimal: No identifiable suicidal ideation.  Patients presenting with no risk factors but with morbid ruminations; may be classified as minimal risk based on the severity of the depressive symptoms  Discharge Diagnoses:   AXIS I:  Bipolar Disorder NOS AXIS II:  Deferred AXIS III:   Past Medical History  Diagnosis Date  . Asthma     "grew out of it around 18 years of age"  . Acne   . Menorrhagia   . Attention deficit hyperactivity disorder (ADHD)     misdiagnosis  . Auditory processing disorder 2014   AXIS IV:  other psychosocial or environmental problems and problems related to legal system/crime AXIS V: 70 upon discharge   Plan Of Care/Follow-up recommendations:  Activity:  As tolerated Diet:  Regular Tests:  NA Other:  See below  Is patient on multiple antipsychotic therapies at discharge:  No   Has Patient had three or more failed trials of antipsychotic monotherapy by history:  No  Recommended Plan for  Multiple Antipsychotic Therapies: NA  Patient is leaving unit in good spirits. Plans to go live with parents. [facility], and has screening appointment at 11 AM today.   COBOS, FERNANDO 10/12/2014, 9:17 AM
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
## 2014-10-13 ENCOUNTER — Other Ambulatory Visit (HOSPITAL_COMMUNITY): Payer: BLUE CROSS/BLUE SHIELD | Admitting: Licensed Clinical Social Worker

## 2014-10-13 DIAGNOSIS — F3162 Bipolar disorder, current episode mixed, moderate: Secondary | ICD-10-CM | POA: Diagnosis present

## 2014-10-13 DIAGNOSIS — G4700 Insomnia, unspecified: Secondary | ICD-10-CM | POA: Insufficient documentation

## 2014-10-13 NOTE — Psych (Signed)
[facility]al Health Partial Program Assessment Note  Date: 10/13/2014 Name: Renee Mcclain MRN: [id number]  Chief Complaint: " I was in the hospital"      HPI: Patient is a 18 y.o. Caucasian female presents with mood disorder and substance abuse issues .  Patient was enrolled in partial psychiatric program on 10/13/2014.  Patient is known to me from recent inpatient psychiatric admission. She is 18 years old, single, normally lives alone, but is currently staying with her parents. She is enrolled in college, Dance movement psychotherapist.  She was recently admitted to inpatient psychiatric admission last week. She was discharged 2 days ago. She was admitted following an impulsive overdose on Klonopin. Patient states she was recently charged with a DUI. She states she was pulled over and had a BAL of .04, but since she is underage, she was charged. She then had a conversation with her father where " he told me how disappointed he was with me", which resulted in acute suicidal ideations and above mentioned impulsive overdose. At this time patient is feeling better and is not suicidal, she is not endorsing major neuro-vegetative symptoms of depression. Sleep "OK", appetite "so-so", energy level "OK", denies anhedonia, denies thoughts of suicide. No psychotic symptoms. Patient states that she had a previous legal charge for cannabis possession in October and before then had  Been charged with speeding.  This accumulation of legal issues has been highly stressful for her, and has also led for her to feel " like I'm a disappointment to my parents" and that " I'm the black sheep of the family"  I have reviewed the following documentation dated  10/12/14 ( discharge summary from inpatient unit and other pertinent documentation)   Complaints of Pain: none Past Psychiatric History:  Patient has been diagnosed with Bipolar Disorder Spectrum Disorder and reports episodes of increased energy and  elevated mood- no frank mania or psychosis, no history of severe depressive episodes. Describes significant anxiety, particularly Panic and some degree of agoraphobia. Does not endorse PTSD or OCD or Psychosis.    Currently in treatment with Abilify. Enrolled in PHP today.  Substance Abuse History: Cannabis Dependence- was smoking daily  Use of Alcohol: episodic  Use of Caffeine: within normal Use of over the counter: denies  Denies IVDA   Past Surgical History  Procedure Laterality Date  . Tympanostomy    . Dental surgery      Past Medical History  Diagnosis Date  . Asthma     "grew out of it around 18 years of age"  . Acne   . Menorrhagia   . Auditory processing disorder 2014  As above- patient also states she has developed frequent vomiting over last several months, has been worked up extensively, to include  Abdominal echo (?) , endoscopy, colonoscopy, and states work up was negative. States she was told vomiting most likely related to anxiety Outpatient Encounter Prescriptions as of 10/13/2014  Medication Sig  . albuterol (PROVENTIL HFA;VENTOLIN HFA) 108 (90 BASE) MCG/ACT inhaler Inhale 1 puff into the lungs every 6 (six) hours as needed for wheezing or shortness of breath (shortness of breath).  . ARIPiprazole (ABILIFY) 5 MG tablet Take 1 tablet (5 mg total) by mouth at bedtime.  Marland Kitchen desogestrel-ethinyl estradiol (KARIVA,AZURETTE,MIRCETTE) 0.15-0.02/0.01 MG (21/5) tablet Take 1 tablet by mouth daily.  . hydrOXYzine (ATARAX/VISTARIL) 25 MG tablet Take 1 tablet (25 mg total) by mouth 3 (three) times daily as needed. (Patient not taking: Reported on 10/12/2014)  . hydrOXYzine (  ATARAX/VISTARIL) 50 MG tablet Take 1 tablet (50 mg total) by mouth at bedtime.  . Multiple Vitamin (MULTIVITAMIN WITH MINERALS) TABS tablet Take 1 tablet by mouth daily. (Patient not taking: Reported on 10/12/2014)  . ondansetron (ZOFRAN) 4 MG tablet Take 4 mg by mouth every 4 (four) hours as needed for nausea  or vomiting (nausea).   Allergies  Allergen Reactions  . Azithromycin Hives    History  Substance Use Topics  . Smoking status: Never Smoker   . Smokeless tobacco: Never Used  . Alcohol Use: 0.6 oz/week    1 Glasses of wine per week   Functioning Relationships: recent break up with boyfriend of 1 year- states " he was bad news" and feels she made right decision in terminating relationship. Denies being emotionally affected by termination of relationship. Describes somewhat tense  Relationship with parents, particularly father. Education: in college  Other Pertinent History: legal charges as above, court date set for January/16.   Family History- states there is history of Bipolar Disorder in extended family. Uncle attempted suicide. Another uncle is alcoholic. Family History  Problem Relation Age of Onset  . Heart disease Maternal Grandfather   . Hypertension Paternal Grandfather   . Cancer Maternal Grandmother     pancreatic  . Bipolar disorder Maternal Uncle   . Insomnia Maternal Uncle   . Bipolar disorder Paternal Grandmother   . Insomnia Paternal Grandmother      Review of Systems Used to have headaches, but resolved with eyewear/glasses, no chest pain, no shortness of breath, no coughing, (+) frequent spontaneous vomiting, no diarrhea, no hematemesis, no dysuria, no edema, no rash, no fever   Objective:  There were no vitals filed for this visit.  Physical Exam:   Mental Status Exam: Appearance:  Well groomed Psychomotor::  Within Normal Limits Attention span and concentration: Normal Behavior: calm and cooperative Speech:  normal pitch Mood:  At this time denies depression, remains somewhat anxious  Affect:  normal and mildly anxious Thought Process:  goal directed Thought Content:  No suicidal or homicidal ideations, no psychotic symptoms Orientation:  Oriented x 3  Cognition:  grossly intact Insight:  Present  Judgment:  Fair Estimate of  Intelligence: Average Fund of knowledge: Aware of current events Memory: Recent and remote grossly intact  Abnormal movements: None Gait and station: Normal  Assessment: Patient is a 18 year old female, recently discharged from psychiatric unit, where she was admitted following an impulsive overdose on Klonopin, which in turn followed a DUI charge and a conversation with  Her father where he stated he was disappointed in her. She does describe a pattern of recent legal issues, to include being charged with speeding and with cannabis possession in the recent past, and states she feels she has become " the black sheep of the family". She was not particularly depressed prior to her impulsive overdose and currently minimizes depression or neuro-vegetative symptoms of depression. She has been diagnosed with Bipolar Disorder, but denies any full blown manic episodes. Does describe episodes of hours to days of increased energy and elated mood. She has a history of cannabis dependence, and it is unclear to what degree these mood symptoms are primarily substance induced. She is currently on Abilify which she is tolerating well.  Diagnosis: Primary Diagnosis: Bipolar 1 disorder, mixed, moderate [F31.62] 1. Bipolar 1 disorder, mixed, moderate   2. Episodic mood disorder   CANNABIS DEPENDENCE   Indications for admission: At this time warrants this level of treatment  based on recent significant suicide attempt and substance dependence issues   Plan: patient enrolled in Partial Hospitalization Program, patient's current medications are to be continued and side effects of medications have been reviewed with patient  Treatment options and alternatives reviewed with patient and patient understands the above plan.      Nehemiah MassedOBOS, FERNANDO, MD
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
## 2014-10-14 ENCOUNTER — Ambulatory Visit (HOSPITAL_COMMUNITY): Payer: Self-pay | Admitting: Licensed Clinical Social Worker

## 2014-10-14 ENCOUNTER — Other Ambulatory Visit (HOSPITAL_COMMUNITY): Payer: BC Managed Care – PPO | Attending: Psychiatry | Admitting: Licensed Clinical Social Worker

## 2014-10-14 DIAGNOSIS — H9325 Central auditory processing disorder: Secondary | ICD-10-CM

## 2014-10-14 DIAGNOSIS — F3162 Bipolar disorder, current episode mixed, moderate: Secondary | ICD-10-CM

## 2014-10-14 DIAGNOSIS — F39 Unspecified mood [affective] disorder: Secondary | ICD-10-CM

## 2014-10-14 DIAGNOSIS — F1220 Cannabis dependence, uncomplicated: Secondary | ICD-10-CM | POA: Insufficient documentation

## 2014-10-14 NOTE — Progress Notes (Signed)
Patient Discharge Instructions:  Next Level Care Provider Has Access to the EMR, 10/14/14  [facility] access.  Jerelene ReddenSheena E Paxtang, 10/14/2014, 3:41 PM
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
## 2014-10-15 ENCOUNTER — Other Ambulatory Visit (HOSPITAL_COMMUNITY): Payer: BLUE CROSS/BLUE SHIELD | Admitting: Licensed Clinical Social Worker

## 2014-10-15 DIAGNOSIS — F3162 Bipolar disorder, current episode mixed, moderate: Secondary | ICD-10-CM | POA: Diagnosis not present

## 2014-10-15 NOTE — Psych (Signed)
[facility] BH PHP THERAPIST PROGRESS NOTE  Renee Mcclain [id number]  Session Time: 11:00 AM-12:00 PM  Participation Level: Active  Behavioral Response: CasualAlertEuthymic  Type of Therapy: Process Group  Treatment Goals addressed: Anger, Anxiety, Communication: With Parents, Coping and Diagnosis: Bipolar I, Cannabis Dependence  Interventions: CBT, Solution Focused, Strength-based and Other: Relapse Prevention  Summary: Renee Mcclain is a 18 y.o. female who presents to therapist smiling and she related that she was around a lot of people who were smoking pot at her apartment and she said "no". Her thoughts were that she is actually trying to do something. She is in a good mood because of her decision. She thought "I don't need to do it" and If she is "withdrawing then I don't want to add to it". She doesn't want to add to anxiety she is already experiencing. Yesterday when she was here she thought she couldn't do it but she explains that she feels like she is two different people and that accompanying that are different thought processes. Therapist pointed out though that there were underlying train of events that were stressors that led to her emotions in group yesterday when she found out that there were more repercussions from DUI. Therapist pointed out stressors contribute to her reaction which means more awareness of what is triggering mood and then along with awareness she can utilize effective coping strategies and in particular stress management techniques. Adylin also had other good news that her grandfather will help pay for her car that helps with fiances and with her car insurance. Her boyfriend contacted her. She related she doesn't want to be with him because there has been nothing but problems with family and she can see issues with his character. When he got into her life bad things starting happening. She ran into problems because she put him before everyone else and her family.  We identified an unhealthy pattern that "I am a runner". She realizes that this has led to more problems, and therapist pointed out that what she is doing now in facing things will help her resolve problems. We discussed her living situation and that her roommate and boyfriend sit around and smoke pot and he is moving in to the apartment in January. It will be cheaper for her. She asked then not to do it in living room and explained that "this is her life". If it gets bad her [facility] will help and her parents will help.   Suicidal/Homicidal: No  Therapist Response: Therapist provided positive feedback as supportive interventions and strength based interventions for the healthy choices and insight she is showing recently. Therapist worked on Advertising copywriter with client of the need to identify underlying events that lead to her emotions in order to better understand her emotional reactions. In addition, recognizing underlying events will help her be more able to implement coping strategies to manage her emotions and in particular the need to work on stress management techniques.Therapist emphasized that self-care is a priority for client in order to manage stressors and will help her to change unhealthy patterns of the past including putting her boyfriend first. We identified high risk situation of her living situation and helped her recognize she needs a plan to manage it. Therapist reviewed with client her unhealthy coping strategy of running away from things and discussed how facing problems is a better coping strategy.   Plan: 1.Client work on identifying triggers that lead to negative mood.2.Client address triggers utilizing effective coping strategies  including stress management.3.Client implement relapse prevention tools.4.Client continue to make healthy choices to manage anxiety.[facility]   Session Time: 12:15 PM-1:15 PM  Participation Level: Active  Behavioral Response:  CasualAlertEuthymic  Type of Therapy: Psycho-educational  Treatment Goals addressed: Anger, Anxiety, Stress Management and Self-Care, Coping and Diagnosis: Bipolar I, Cannabis Dependence  Interventions: CBT, Solution Focused, Strength-based and Other: Relapse Prevention  Summary: Renee Mcclain completed a self-care assessment. She identified areas that need to improve and she relates that she is actively working on getting better with her emotional, psychological and physical self-care. She feels she is doing all right with her relationship self-care and does not want to address her spiritual self-care. She ranked her physical self-care as "I do this OK" but she realizes that it was related to her stress in the past. She went to a gastroenterologist because she threw up but realizes that it was related to the stress that built up that caused her to throw up. The stress has gotten better but she threw up. She agrees that she has to work on a stress management plan so that she doesn't let it build up. She agrees that living healthier will help her better manage stress. She plans to go to the gym with her Dad on Saturday. She still has trouble sleeping. The adult coloring book helps her emotional self-care is doing better. Specifically, she is in contact with important people in her life and spending time with others whose company she enjoys.    Suicidal/Homicidal: No  Therapist Response: Therapist reviewed self-assessment of self-care with client and identified areas that client can work on to improve. Self-care was identified as a stress management tool so that client does not let things build up to the pont where it affects her physically.  Plan:1.Client continue to take steps to improve plan of self-care that include steps such as eating better and going to the gym and overall addressing improvement in emotional, physical and psychological self-care.2.Client will take steps to address stress so that does not  let it build up.[facility]  Session Time: 1:15 PM-2:15 PM  Participation Level: Active  Behavioral Response: CasualAlertEuthymic  Type of Therapy: Psycho-educational  Treatment Goals addressed: Anxiety, Stress Management, Communication with her Parents Coping and Diagnosis: Bipolar I, Cannabis Dependence  Interventions: CBT, DBT, Solution Focused, Strength-based, Reframing   Summary: We reviewed two worksheets related to stress management. The first was "Learn About Your Stress: and the second was "Healthy Ways to Cope with Stress. She first explained that the hospitalization helped her to realize that "It is never going to be that bad and I realize it now". She also realized the people who care for her including her family and friends. We reviewed worksheet and she said that the situation that causes her stress is the thought of knowing that she disappointed her parents. In particular when she hears the work "disappointment", this sets her off. Her physical symptoms include throwing up and getting upset, sad and anxious. We looked at what she can and can't control. She identified that she can control her behavior and her reaction to their disappointment. Therapist reframed so that she could understand that parent's love as unconditional and worrying about her well-being. Also, it was important to think about what made her happy. In addition to recognizing what she couldn't control, she thinks exercise such as mindfulness she learned yesterday could help with stress as well as over all self-care. She thinks that mindfulness can help  since she recognizes she gets herself too worked up in her head and needs to get out of that habit and can see how taking a 5 minute breather to slow things down can be helpful. We reviewed other meditation and breathing activities from worksheet such as focus on breath and breathing and she agrees to practice one exercise for  homework.  Suicidal/Homicidal: No  Therapist Response: We reviewed stress management worksheet and looking at what we can and can't control as stress management techniques. We also looked at identifying techniques as mindfulness and using these strategies so that things don't build up. Therapist also encouraged client to utilize supports in order for her not to let things build up. Therapist utilized reframing to challenge client's trigger that she disappointed her parents by recognizing her parent's love is unconditional and to think about what will make her happy. Therapist reinforced powerful insights client gained from hospital that nothing will be that bad and there are people who really care about her. Therapist encouraged client to develop some new strategies of stress management since some of her older strategies had not worked. Therapist some new meditation practices for client for her to practice.    Plan: 1.Renee Mcclain will pick and practice a meditation exercise for weekend.2.Client will continue to practice healthier stress management techniques including cognitive strategies including reframing triggers.3.[facility]  Session Time: 2:15-3:15 PM  Participation Level: Active  Behavioral Response: CasualAlertEuthymic  Type of Therapy: Group Therapy   Treatment Goals addressed: Anger, Anxiety, Stress Management, Self-Esteem, Coping and Diagnosis: Bipolar I, Cannabis Dependence  Interventions: CBT, Solution Focused, Strength-based and Other: Relapse Prevention, Art Therapy  Summary: Client made a collage of the way she was before and the way she is now. It was a way for her to relax and express herself artistically. It was clear that she enjoyed this creative self-expression and was a positive outlet for her. We discussed her collages and this medium helped her to explain how much better she is doing today. She was able to effectively explore her emotions and it was also a good  reliever of stress for her. She was very engaged and enthusiastic about the activity and it had a very beneficial therapeutic value  Homicidal/Suicidal: No  Therapist Response: Therapist utilized art therapy by having client make a collage to share how she was before and how she is doing now. It was a positive outlet for her in relieving stress, exploring her emotions and improve mental well-being through taking part in the creative process. Therapist positive feedback because of the effort she put into the product and because of how effectively her collage was able to express herself in the past and the present.      We reviewed her safety plan for the weekend. She is going to find somebody to hang out with tonight. She is going to do laundry on Saturday at her parents and Sunday she is going to go to work.   Plan: 1.Client will implement plan for her weekend.2.She will implement newly learned strategies for stress and self-care including meditation exercise, positive steps for self-care and creative outlets. 3.[facility]      Diagnosis: No Primary Diagnosis found   No diagnosis found.    Bowman,[name] 10/15/2014

## 2014-10-18 ENCOUNTER — Ambulatory Visit (HOSPITAL_COMMUNITY): Payer: Self-pay | Admitting: Licensed Clinical Social Worker

## 2014-10-18 ENCOUNTER — Other Ambulatory Visit (HOSPITAL_COMMUNITY): Payer: BLUE CROSS/BLUE SHIELD | Admitting: Licensed Clinical Social Worker

## 2014-10-18 DIAGNOSIS — F3162 Bipolar disorder, current episode mixed, moderate: Secondary | ICD-10-CM

## 2014-10-18 DIAGNOSIS — H9325 Central auditory processing disorder: Secondary | ICD-10-CM

## 2014-10-18 DIAGNOSIS — F39 Unspecified mood [affective] disorder: Secondary | ICD-10-CM

## 2014-10-18 NOTE — Psych (Addendum)
   [facility]  Renee Mcclain [id number]  Session Time: 2:00pm-3:00pm  Participation Level: Minimal  Behavioral Response: Bizarre, Guarded and wearing shorts and a sweatshirtDrowsy and LethargicNegative  Type of Therapy: Group Therapy  Treatment Goals addressed: Coping and Diagnosis: Anxiety  Interventions: CBT, DBT, Motivational Interviewing and Meditation: Progressive Relaxation  Summary: Renee GleeSarah E Mcclain is a 18 y.o. female who presents with drowsiness. Renee Mcclain reported that she has been unable to sleep. She states that this is normal for her as she was not able to sleep before she went into the hospital. Renee Mcclain states that for the past two to three days she lies in her bed in the dark around 10 pm but she is unable to fall asleep until 3 am or so. She states that she was able to sleep when she was in the hospital but is not able to sleep anymore. She reported that she has utilized the DBT activity she was taught last week to help her calm herself when she felt anxious.    Suicidal/Homicidal: Negativewithout intent/plan  Therapist Response:  Therapist suspects possible ongoing and/or alternative substance use. Therapist questioned her medication dosages and suggested she be re-assessed for medication appropriateness.  Therapist offered to contact the nurse to speak with Renee Mcclain and Renee Mcclain appeared a bit irritated by this. Therapist questioned Renee Mcclain's explanation of why she is unable to sleep when she is on a higher dosage of medication than she was during her inpatient stay. Renee Mcclain did not provide an adequate reason except to state that she is unsure as to why her medication is not working. Therapist again offered to have her consult with the nurse and Renee Mcclain again refused stating " the nurse upstairs said I can just take half of a pill if its not working." Therapist taught Renee Mcclain two progressive relaxation techniques to assist her with being unable to sleep.    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

## 2014-10-18 NOTE — Psych (Signed)
[facility] THERAPIST PROGRESS NOTE  Renee GleeSarah E Zurcher [id number]  Session Time: 11:00 AM-12:30 PM   Participation Level: Minimal  Behavioral Response: CasualDrowsy and LethargicIrritable  Type of Therapy: Process Group  Treatment Goals addressed: Anxiety, Communication: With Parents, Coping and Diagnosis: Bipolar, Cannabis Dependence  Interventions: CBT, Motivational Interviewing, Meditation: Focus on the Breath and Other: Sleep Hygiene, Psycheducational, Coping Strategies  Summary: Renee Mcclain is a 18 y.o. female who presents with irritability and drowsiness. We reviewed her weekend and she said it was "OK" and "nothing bad happened". She did go for a walk to do some exercise but she said it was boring. Her Dad is out of town and will be willing to go to the gym when he is in town. She did not elaborate any further. She practiced a mindfulness exercise for her homework assignment to practice one of the relaxation exercises from handout. She did not endorse the exercise as helping that much. We did a breathing exercise in session for her to practice. She relates that she is still tired. We reviewed her sleeping problems. She goes to sleep at 10:00 and still awake at 12:00 PM so she turns on the television. She gets to sleep 3:30-4:00 AM so she has only had 5 hours of sleep. She relates she slept well in the hospital but not since discharge. She denies any significant stress that would cause her problems. She said she has had sleep problems for awhile and that smoking marijuana helped her to sleep and that the Klonopin was prescribed to help her sleep prior to hospitalization. Therapist reviewed how it is common to have difficulties sleeping in early recovery, and that most people find that once they have been sober for a few weeks they regain the ability to enjoy a full night's sleep. We reviewed how chemicals have been influencing the individual's sleep pattern and it can take time to  adjust to a normal sleep cycle. People who take sleep medications may find that it takes a bit of time before they are able to once again sleep naturally. We reviewed whether she had any ruminating thoughts about the future and people can find sleep difficult if they are too worried about the future. She denies this is occurring. Therapist reinforced that it may be tough at first but the reward will come when she is on a healthy sleep cycle that is not chemically induced and off of addictive substances. We identified sleep as important to health and to anxiety and depression. It can cause irritability and she endorsed this. It can lead to relapse. We reviewed sleep hygiene including exercising during the day, not staying in bed if she can't sleep, and doing something calming such as drawing on relaxation techniques. In session client also related that coming here interferes with getting back to a regular routine. It is unclear why she was able to sleep in the hospital and now unable to sleep. This needs to be explored further. She is at risk of relapse with signs such as irritability, problems with sleep and lack of insight to value of treatment. Her risk of relapse will need to be explored with client, as well as working on relapse prevention strategies. Medication will also need to be reviewed with doctor.        Suicidal/Homicidal: No  Therapist Response: Therapist explored reasons client is having with sleep. Therapist worked on helping client see how sleep problems can be connected to early recovery. Therapist utilized motivational  strategies to help client see the physical and mental health benefits of getting back to a normal sleep cycle as well as getting off of a chemically addictive substance as well and insight that it can take the body time to adjust to a normal sleep cycle. We practiced a new relaxation technique for anxiety and also reviewed sleep hygiene. strategies including the suggestion to  utilize relaxation technique prior to sleep to help with sleep. Client's irritability, lack of sleep, and lack of insight to value of treatment indicate risk of relapse. Therapist worked on relapse prevention and this will need to be continued with client. Her medication will also need to be reviewed with her doctor including why she was able to sleep in the hospital but not currently. It appears that a drug screening would be valuable to rule out out drugs in her system.    Plan: 1.Client will practice sleep hygiene and other strategies such as relaxation strategies to help with sleep.2.Therapist will work with client on relapse prevention.3.Therapist will consult with psychiatrist on medication regimen due to sleep problems. 4.Review with treatment team need for urine drug screen.   Session Time: 12:45 PM-1:30 PM   Participation Level: Minimal  Behavioral Response: CasualDrowsy and Lethargic  Type of Therapy: Psych-education  Treatment Goals addressed: Anxiety, Communication: With Parents, Coping and Diagnosis: Bipolar, Cannabis Dependence  Interventions: CBT, DBT, Psycheducational, Coping Strategies  Summary: Client presents as drowsy and difficult to engage because of her drowsiness. We reviewed a worksheet on Emotional Regulation Skills and therapist pointed out that these skills she can practice before her emotions become intense, skills in moments of intense emotions to help reduce their intensity, skills to help her manage through difficult  emotions and skills to implement after you experience the intense emotion. She had little interaction with therapist about worksheet. Client needs to work on and practice these skills as she still needs to work on skills to help her manage emotions and stressors in healthy ways. She did not show good engagement, however, with this exercise. Therapist also presented in an interactive format a video called "Everyting You Think You Know About Addiction is  Wrong" by Bertell MariaJohann Hari from [employer]. The message of the video was that humans need to bond to something and that addiction is an adaption to one's environment. It is a sign of isolation and disconnection. In helping someone in addiction, one has to help them find their purpose and rediscover their bonds in society. Client's one comment was that "I can't be alone". Therapist related that in addiction to maintaining healthy bonds with people she loves, she needs to learn strategies to be on her own. This includes recognition that times where we are by ourselves are part of life and there are ways we can be on our own that can be satisfying. During session, therapist also revisited stress management exercise from last session where client identified that a trigger is disappointing her parents. Therapist said an additional strategy is making good choices and that life goes better when we make better choices. Client did not engage, respond or express interest in this strategy. Client needs to work on sleep issues as this impacts her ability to benefit from group activity. Her lack of engagement may be related to addictive behaviors and this will need to be addressed.    Therapist Response: Therapist introduced DBT skills of emotional regulation. Psycho-education was also utilized in insights about addiction and the need to bond, be connected and find  purpose as ways to help with addiction. Client's comment that she can't be alone was explored. Therapist worked on building insight as to being alone at times is a part of life and the way we can find satisfying ways to be alone. Therapist reviewed one of client's main stress triggers from last session which was disappointment of her parents. Therapist suggested making helping choices makes life work better. Therapist continues to assess for addictive attitudes and behaviors.  Suicidal/Homicidal: No   Plan: 1.Client will work on emotional regulation and stress  management techniques learned in session.2.Therapist will continue to assess for addictive attitude and behaviors in order to implement effective interventions.3.Client will learn more strategies to help her stay on track with her recovery.4.Client will return to PHP.   Diagnosis: No Primary Diagnosis found   No diagnosis found.    [facility] PHP CLINIC 10/18/2014

## 2014-10-19 ENCOUNTER — Ambulatory Visit (HOSPITAL_COMMUNITY): Payer: Self-pay | Admitting: Licensed Clinical Social Worker

## 2014-10-19 ENCOUNTER — Other Ambulatory Visit (HOSPITAL_COMMUNITY): Payer: BLUE CROSS/BLUE SHIELD | Attending: Psychiatry | Admitting: Licensed Clinical Social Worker

## 2014-10-19 DIAGNOSIS — H9325 Central auditory processing disorder: Secondary | ICD-10-CM

## 2014-10-19 DIAGNOSIS — F3162 Bipolar disorder, current episode mixed, moderate: Secondary | ICD-10-CM

## 2014-10-19 DIAGNOSIS — F39 Unspecified mood [affective] disorder: Secondary | ICD-10-CM

## 2014-10-19 NOTE — Psych (Signed)
[facility]  Renee Mcclain [id number]  Session Time: 11:00 AM-12:30 PM  Participation Level: Active  Behavioral Response: CasualAlertDepressed, Dysphoric and Irritable  Type of Therapy: Process Group  Treatment Goals addressed: Anxiety, Communication: With Parents, Coping and Diagnosis: Bipolar, ADHD  Interventions: CBT, Supportive, Reframing and Other: Coping Skills  Summary: Renee Mcclain is a 18 y.o. female who presents with lack of sleep again and irritabiltity. She practiced the relaxation exercise and took a nap, but again utilized the exercise at night but couldn't sleep. We reviewed her medications and she does not take Vistaril during the day but takes Abilify at night with Vistaril. She was able to sleep in the hospital with night medications but not at home. We reviewed her treatment history and symptom history. She was told this summer that she could be bipolar, she "went nuts" on an anti-depression, endorses mood swings(although she was using marijuana for recent history), impulsivity, and periods of high energy. Given that the medications are not working for sleep, it would be helpful to look at her symptoms that point in the direction of a bipolar spectrum. As we reviewed her problems with sleep, client became tearful. She disclosed that she just had a fight with her Dad and was tearful. Her Dad asked her questions that she doesn't have answers for. She said that "I don't know how to control my emotions". Therapist explained that in times of stress, it is normal to have conflict as we have to cope with situations that are not what we are used to managing. As we looked at her history in treatment, we identified mood swings and one day she will come in happy and another day irritable. We explored that it makes sense with all her legal issues that she doesn't have all the answers and that she wants to take time off of school to figure things out and  stabilize. Therapist worked with client on coping strategies for anxiety and depression and reviewed a worksheet? The worksheet stressed worked on concentrating on what you can do in the present and suggested some self-coping statements to help her focus on the present and get through what she needed to get through. I asked her what she would tell a friend and she said that this has been the worst year of her life and wouldn't wish this on anyone. She became tearful again. Therapist worked on Medical sales representativechanging cognition and perspective and encouraging her to see her value despite anything she may go through. Client needs to work on changing negative cognitions, effective coping strategies to help her manage stress, supportive interventions, work on self-esteem, look at symptoms that could be bipolar, and medication adjustment for mental health symptoms including sleep.   Suicidal/Homicidal: No  Therapist Response: Therapist provided supportive interventions related to conflict with parents, problems with sleep and management of significant stress. Therapist reviewed client's mental health symptoms as she struggles with mood swings, depression and lack of sleep to review possibility of bipolar and medication change. Therapist worked with client on challenging cognitions and perspective taking related to stressors including legal issues and utilized worksheet to teach her some coping strategies for anxiety and depression.   Plan: 1.Consult with treatment team about possible symptoms of bipolar and medication adjustments.2.Therapist provide supportive interventions.3.Therapist utilize CBT strategies to challenge client's negative cognitions.4.Client utilize helpful coping strategies to manage anxiety and stress.[facility]  Session Time: 2:00 PM-3:00 PM  Participation Level: Active  Behavioral Response: CasualAlertDepressed,  Calm  Type of Therapy: Psychoeducational, Activity Group  Treatment Goals  addressed: Anxiety, Coping and Diagnosis: Bipolar, ADHD  Interventions: CBT, Supportive, Reframing and Other: Coping Skills, Art Therapy  Summary: We reviewed progress for today and she related that she has more insight that being sad and negative does not help her, and does not allow her to use her energy in a positive way to impact parts of her life that she can control. She is starting to understand that changing the way she thinks gives her access to helpful coping strategies. She realizes that her homework assignment of making affirmations will help to replace negative cognitions through practice. She is gaining insight about CBT and how our reality is determined by our perceptions and the way we think so changing our perceptions will change how we relate and feel to what is happening to us. She read a passage titled "Disputing Self-Talk" which reinforce CBT concepts. Client still has guilt about the problems she has caused. There is a positive aspect in helping motivate her to change and learn from mistakes but the guilt also caused her to be very hard on herself which leads to significant depressive symptoms including lack of self-esteem. In order for depressive symptoms to improve and for her to have access to more productive copings strategies she needs to see see some of her positive qualities. In session, client also exercise where she colored a Mandala as a means to quiet inner dialogue and bring unconscious thoughts and feelings to emerge. It was also a way for her to relax and release tension. She was directed ask what she might do to change your life so that you have less stress and anxiety. Client had not finished by end of group, but she said that coloring was something she did often to relieve stress but had not come up with any ideas about what she could do to have less stress and anxiety.  She did said that she needed to start liking herself better. Therapist gave her positive feedback as a  positive step in treatment.   Suicidal/Homicidal: No  Therapist Response: Therapist reviewed insights about CBT with client so that she would be able to use useful coping strategies to manage stressors. Therapist encouraged her to utilize positive affirmations to help her replace negative cognitions with positive statements. Therapist is working with client not only on challenging negative perceptions but also helping to build client's self-esteem to manage depressive symptoms. Therapist did a Mandala exercise with cleint to release tension and help release inner thoughts and feelings.   Plan: 1.Client continue to apply insights of CBT to implement effective coping strategies.2.Therapist work on challenging client's negative perceptions and helping to build client's self-esteem.[facility]      Diagnosis: No Primary Diagnosis found   No diagnosis found.    [facility] CL[facility] 10/19/2014

## 2014-10-19 NOTE — Psych (Signed)
   [facility]  Renee GleeSarah E Mcclain [id number]  Session Time: 12:45pm-1:30pm  Participation Level: Active  Behavioral Response: Fairly GroomedAlertAnxious, Hopeless, Worthless and Tearful  Type of Therapy: Group Therapy  Treatment Goals addressed: Anxiety, Coping and Diagnosis: R/O Bipolar D/O  Interventions: CBT, DBT, Supportive and Reframing  Summary: Renee Mcclain is a 18 y.o. female who presents with depressive symptoms. Renee SagoSarah reported that she had not slept last night and reports feeling that nothing in her life is going well and she does not see anything positive.  Renee SagoSarah reported that nothing ever goes the way she hopes and "I wouldn't wish my stuff on anyone." During the session, Renee SagoSarah became tearful.  She sought clarification on why she needs to view her life as having any positives if she keeps messing up and disappointing her family.  Suicidal/Homicidal: Negativewithout intent/plan  Therapist Response: Renee SagoSarah was given a screening from [facility] to access for symptoms of Bipolar D/O. The screening required that 13 of is sufficient to determine the prevalence of Bipolar symptomology. Renee Mcclain scored 14. Therapist aided Renee Mcclain into developing insight into her current stressors through the use of CBT and grounding techniques. Renee SagoSarah was confronted about her negative perceptions and was aided to identify positives about her situation.   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

## 2014-10-19 NOTE — Psych (Signed)
   [facility]  Renee Mcclain [id number]  Session Time: 3:15 pm-4:00pm  Participation Level: Active  Behavioral Response: Fairly GroomedAlertDepressed, Hopeless and Worthless  Type of Therapy: Group Therapy  Treatment Goals addressed: Anxiety, Communication: w/family and Coping  Interventions: Solution Focused, Strength-based, Supportive and Family Systems  Summary: Renee GleeSarah E Mcclain is a 18 y.o. female who presents with Depressive symptoms. Renee Mcclain has been very emotional during the sessions and has expressed a sense of hopelessness about her future. Renee Mcclain states that she "doesn't want anyone to forgive" her and she wishes "they would all stop being so nice and giving me money." Renee Mcclain became very tearful when describing her relationship with her grandmother who passed away in October of this year. Renee Mcclain reported that she wants to have a relationship with her grandfather but commented that the two of them "don't even talk."     Suicidal/Homicidal: Negativewithout intent/plan  Therapist Response: Therapist sought clarification concerning Brayli's relationship with her grandfather. Therapist sought to assist Renee Mcclain in identifying what she "wants" and to help her to develop a plan to accomplish this. Therapist encouraged Renee Mcclain to complete a list of 5 positive affirmation statements to build self-esteem. Therapist utilized humor, emphatic understanding and imagery to help Renee Mcclain to clarify and redefine the way she views herself, her life and her current situation.     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

## 2014-10-20 ENCOUNTER — Telehealth (HOSPITAL_COMMUNITY): Payer: Self-pay | Admitting: Licensed Clinical Social Worker

## 2014-10-20 ENCOUNTER — Other Ambulatory Visit (HOSPITAL_COMMUNITY): Payer: BLUE CROSS/BLUE SHIELD

## 2014-10-20 DIAGNOSIS — F3162 Bipolar disorder, current episode mixed, moderate: Secondary | ICD-10-CM

## 2014-10-20 NOTE — Psych (Unsigned)
[facility] Medication Management Note- 25 minutes.  Diagnosis - Bipolar Disorder NOS  I have discussed case with treatment team and have met with patient.  Subjective- patient states she is better than she was doing prior to admission. She feels she is benefiting from treatment/group therapies. She denies medication side effects, except for a concern that Vistaril PRNs may not be working particularly well or even causing some paradoxical increased activation. She is tolerating Abilify well, and denies any akathisia, weight gain, sedation. She states she had been sleeping poorly over last two to three nights, but last night slept 7 hours , without any medication. She is anxious about upcoming court date for DUI.   Objective :MSE- Alert , attentive, well groomed, cooperative, calm, has good eye contact, speech normal in tone, volume, rate. No thought disorder. States mood is improved and currently seems euthymic. Affect appropriate, reactive, slightly anxious. Not suicidal , not homicidal , not psychotic- denies hallucinations, no delusions expressed . 0x3. As above, tolerating medications well. Appetite " good" energy level improved, sleep variable, but slept better last night. Seems motivated in therapy and feels she is making gains. As discussed with therapists, patient motivated and participative. Focus on helping her be more assertive and less ambivalent, as she has identified difficulty making decisions or committing as an issue she wants to work on. Patient denies any recent drug use and we have reviewed likelihood that drug use might have contributed to mood episode. Patient endorsed a history of several hypomanic type behaviors in the past, such as periods of increased energy, over spending, less need for sleep, but no history of full blown manic episodes   Patient responds well to support, encouragement, empathy.  Current Medication- Abilify 5 mgrs QDAY - we have  reviewed  side effect profile to include risk of akathisia, abnormal involuntary movements, weight gain, hyperglycemia.   Assessment: Partial Improvement, mood seems improved, and today seems euthymic, although anxious . No SI. Reports abstinence from cannabis/drugs. Tolerating Abilify well.   Plan-  continue Abilify 5 mgrs QDAY.  D/C Vistaril PRNS.  Continue PHP.   Neita Garnet, MD

## 2014-10-21 ENCOUNTER — Other Ambulatory Visit (HOSPITAL_COMMUNITY): Payer: BLUE CROSS/BLUE SHIELD | Admitting: Licensed Clinical Social Worker

## 2014-10-21 DIAGNOSIS — F3162 Bipolar disorder, current episode mixed, moderate: Secondary | ICD-10-CM
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
## 2014-10-21 NOTE — Psych (Signed)
[facility]  Renee Mcclain [id number]  Session Time: 12:45 PM-1:30 PM  Participation Level: Active  Behavioral Response: CasualAlertEuphoric  Type of Therapy: Group Therapy  Treatment Goals addressed: Anxiety, Communication: With Parents, Coping and Diagnosis: Bipolar  Interventions: CBT and Reframing, Coping-Working on Building Self-Esteem  Summary: Renee Mcclain is a 18 y.o. female who participated in group activity of involving discussion of video called "Art of Choosing". The discussion involved appreciating the varied ways that people respond to choices. For example in the American culture the opportunity to make one's own choices is considered empowering but in other cultures such as Asian cultures, choice is seen as better when made as a group. The group discussed as well that choice was not necessarily always good especially when there were too many and one become overwhelmed. The video for client helped client to understand that she had the freedom to make choices and that she did not have to make choices to please other people but for herself. My impression is that this strengthening of ego for client will help improve symptoms of depression, feel more hopeful about her future and help her to better function. She needs to continue to work on strengthening of ego and building insight to effective strategies for better functioning.   Suicidal/Homicidal: No  Therapist Response: Therapist utilized interventions of reframing and self-esteem building to help with client managing stress and mental health symptoms. Therapist continues to work with client on strengthening ego, identity formation and helping client to reframe for better coping.   Plan: 1.Therapist continue to work with client through reframing and CBT strategies to help client with coping and improvement of mental health symptoms. 2.Client continue to work on strategies to build self-esteem  and challenge negative distortions.[facility]  Session Time: 2:00-3:00 PM  Participation Level: Active  Behavioral Response: CasualAlert/Euphoric but then tearful  Type of Therapy: Group Therapy  Treatment Goals addressed: Anxiety, Coping and Diagnosis: Bipolar  Interventions: CBT and Reframing, Coping-Working on Stress Management, strategies to manage mental health and Self-Esteem building  Summary: Client in discussion about negative distortions identified how her perception is changing thanks to work with therapist. She is able to be more grateful for the support she has and not focus on the negative in her life. She realizes that sadness and depression are not useful for her in solving problems. The affirmations have helped to feel more positive about herself and have helped to empower her. Even though she does not know what she plans to do for the future, she feels more positive about her life. Her interaction with group and feedback confirmed her shifting attitude. She became tearful during group discussion as she tried to challenge another group member's rigid perspective of hopelessness. She was frustrated and became tearful as her perspective was shifting but could identify with the struggles of the other group member. Therapist helped to reframe by pointing out that her feedback showed her shifting views and it also helped group process for her to utilize her experience to help someone. Therapist will utilize interventions through group interaction to strengthen and reaffim positive changes client is making so she does not regress. The group can be a challenge but ultimately can be used therapeutically to strengthen growth for when she is discharged.   Suicidal/Homicidal: No  Therapist Response: Therapist utilized CBT approaches to help client in shifting attitude, improving self-esteem and alleviating depression. Therapist will continue to work with client on building  coping  strategies that are effective to help with her current stressors. Therapist will utilize growth process and struggles that arise from interaction with other group members to strengthen client's insights and growth.   Plan: 1.Therapist continue to reaffirm client's growth and insights through struggles in the group process.2.Client continue to challenge negative distortions and apply more effective coping strategies to stressful situations.3.Client return to PHP    Diagnosis: Primary Diagnosis: Bipolar 1 disorder, mixed, moderate [F31.62]    1. Bipolar 1 disorder, mixed, moderate       [facility] PHP [facility]IN[facility] 10/20/2014

## 2014-10-22 ENCOUNTER — Ambulatory Visit (HOSPITAL_COMMUNITY): Payer: Self-pay | Admitting: Licensed Clinical Social Worker

## 2014-10-22 ENCOUNTER — Other Ambulatory Visit (HOSPITAL_COMMUNITY): Payer: BLUE CROSS/BLUE SHIELD | Admitting: Licensed Clinical Social Worker

## 2014-10-22 DIAGNOSIS — F3162 Bipolar disorder, current episode mixed, moderate: Secondary | ICD-10-CM

## 2014-10-22 DIAGNOSIS — H9325 Central auditory processing disorder: Secondary | ICD-10-CM

## 2014-10-22 DIAGNOSIS — F39 Unspecified mood [affective] disorder: Secondary | ICD-10-CM
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
## 2014-10-22 NOTE — Psych (Signed)
[facility]  Renee GleeSarah E Mcclain [id number]  Session Time: 11:00-12:30 PM  Participation Level: Active  Behavioral Response: CasualAlertEuphoric  Type of Therapy: Process Group  Treatment Goals addressed: Anger, Anxiety, Coping and Diagnosis: Bipolar  Interventions: Supportive and Other: Coping Strategies, Ego Development  Summary: Renee Mcclain is a 18 y.o. female who presents with positive mood and first time not wearing shorts. She said that she did two loads of laundry last night and that putting effort into what she was wearing was a positive sign for her. She said that she had been throwing up though and as there were no problems discovered with her stomach, she believes that throwing up is a sign of stress. As we explored any underlying reasons, she was not able to identify any stressors. This is a stressful time for though and client needs to be prompted to discover triggers, so therapist will continue to explore why she may be throwing up. She showed good insight as we discussed relationships as she realizes that her life was spiraling out of control in her previous relationship and it was a good thing she broke up. She said a key reason she didn't break up is that she doesn't like to be alone. We discussed how we can put energy to ourselves and out goals and being alone is a way to develop awareness. It is a way to think and process things, to get to know oneself and like oneself. It is something we have to learn to do. Client will need to work on herself and that includes working on being all right on her own. This will help in developing herself and help her in being in a healthier relationship. During discussion client shows insight as to qualities of healthy relationships. Through physical symptoms, therapist believes client needs to learn to find healthier ways to manage stressors.    Suicidal/Homicidal: No  Therapist Response: Therapist is working with  client on identify development and relationship issues that include helping client learn to be all right on her own. Therapist is providing supportive interventions to help client with stressors and helping client discover underlying triggers for her stressor.  Plan: 1.Client work on healthy identify development by recognizing the value and learning to be alone. 2.Client find healthy management of stressors.3.[facility] and utilize group for healthy processing of issues.   Session Time: 2:00-3:30 PM  Participation Level: Active  Behavioral Response: CasualAlertEuphoric  Type of Therapy: Group Therapy   Treatment Goals addressed: Self-Esteem, Coping and Diagnosis: Bipolar  Interventions: Supportive and Other: Coping Strategies, Ego Development, Strength Based  Summary: Renee Mcclain participated in an activity to describe where she is on the road of life. This intervention gave information about her history and future goals and put her current situation into context. One of the issues for her is her history of being misdiagnosed and reaching out this summer for help and having to wait a long time for a therapy session. She said that this is the reason she has trust issues at first with therapy and why she was resistant to help and therapeutic process. She has made progress in her resistance and this is why I think we are seeing improvement as she is more open to getting help from this process. At this point she can't think about her future goals. She realizes that she needs to given herself time to figure things out. In giving her history she related that one of the  reasons for improvement in symptoms is her recognition of the support she is getting from her family. I think that as client's sense of self continues to improve through therapeutic process, that symptoms will improve. I think that the fact she is starting to forgive herself and to take better care of herself including giving herself time  is another reason therapist is seeing improvement. Client continues to need to work on self-esteem and managing upcoming stressors.   Suicidal/Homicidal: no   Plan:1.Client take positive steps to improve self-esteem. 2.Client continue to utilize effective coping strategies she is learning in treatment to address stressors.[facility]    Diagnosis: Primary Diagnosis: Bipolar 1 disorder, mixed, moderate [F31.62]    1. Bipolar 1 disorder, mixed, moderate       Renee Mcclain A 10/22/2014

## 2014-10-22 NOTE — Psych (Addendum)
   [facility]  Renee Mcclain [id number]  Session Time: 12:45-2:00  Participation Level: Minimal  Behavioral Response: NeatAlertAnxious and Worried  Type of Therapy: Group Therapy  Treatment Goals addressed: Anxiety, Coping and Diagnosis: mood d/o  Interventions: CBT, Strength-based, Assertiveness Training and Supportive  Summary: Renee Mcclain is a 18 y.o. female who presents with anxious mood. Renee Mcclain reported that one of her friend had been in a bad car accident last night and she was really worried about her. She processed the fact that her friend had been drinking and, like her situation, the accident could have been much worse. She expressed being very worried about her and stepped out of the group room a few times to call her friend and her friend's mother to check on her. Renee Mcclain did not participate much in the group because she stated that she was worried about her friend .     Suicidal/Homicidal: Negativewithout intent/plan  Therapist Response: Therapist observed that another pt is garrulous and  dominates most of the sessions. Renee Mcclain tends to allow him to dominate and fades into the background. When a question or any input is directed toward her she states "I don't know. I'm over thinking it I guess." Therapist challenged her to be more assertive with participating in group. Therapist used an example of a common value all in group may have and connected this to individual self-reported values and beliefs. Therapist probed and addressed lingering negative beliefs and assisted her in developing more positive beliefs. Therapist identified several values and taught Renee Mcclain continue to define positive and negative beliefs based upon those values. Therapist focused on keeping Renee Mcclain engaged in group activities by asking for clarification from her every so often.     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

## 2014-10-22 NOTE — Psych (Signed)
   [facility]  Renee Mcclain [id number]  Session Time: 12:45-1:30  Participation Level: Active  Behavioral Response: Fairly GroomedAlertAnxious and Positive  Type of Therapy: Group Therapy  Treatment Goals addressed: Anxiety and Coping  Interventions: CBT, DBT, Solution Focused and Strength-based  Summary: Renee Mcclain is a 18 y.o. female who presents with symptoms of anxiety. Renee Mcclain reports that last, night she spoke with her father about the incident which happened in group on yesterday. She reports that her father shared some storied with her about a similar incident he'd experienced. She reported that she felt better after speaking with her father and did not feel the need to return the phone call placed by the therapist on yesterday to check on her. She reported that she was able to use her coping skills to reduce feelings of anxiety.     Suicidal/Homicidal: Negativewithout intent/plan  Therapist Response:  Therapist administered the PHQ-9 Depression Screening in which she scored a 5.  Therapist explained the scored and reviewed patient's answers for any indications of stressors. Therapist explored pt's feelings about the session on yesterday and encouraged her to continue using her supports and practicing her coping skills.   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

## 2014-10-22 NOTE — Psych (Signed)
   [facility]  Kristopher GleeSarah E Bia [id number]  Session Time: 3:15pm-4:00pm  Participation Level: Active  Behavioral Response: Fairly GroomedAlertCheerfulTearfulFear  Type of Therapy: Group Therapy  Treatment Goals addressed: Anxiety and Coping  Interventions: Solution Focused, Supportive, Anger Management Training and Other: Crisis Intervention  Summary: Kristopher GleeSarah E Funke is a 18 y.o. female who presents with anxious mood. During the session, another group member expressed a desire to end his life. [name] became visibly upset and cried. She attempted to convince the group member that she felt the same way but realized through the program that she had been seeing the issue as a cognitive distortion. She was able to utilize a mindfulness approach and self-soothe in order to refocus on the present group activity.    Suicidal/Homicidal: Negativewithout intent/plan    Therapist Response: Therapist utilized CBT to attempt to redirect group focus to a more positive and constructive lesson. Therapist attempted to teach the group how to utilize various coping skills when faced with situations in which they feel hopeless and distraught. Therapist attempted to explore with the group self-defeating life patterns and beliefs in order to review individual stressors and self-imposed limits. Therapist was able to deescalate the group enough to process with [name] her fear and anxiety responses.    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

## 2014-10-22 NOTE — Psych (Addendum)
   [facility]  Renee Mcclain [id number]  Session Time: 3:15-4:00  Participation Level: Active  Behavioral Response: Fairly GroomedAlertNA  Type of Therapy: Group Therapy  Treatment Goals addressed: Anxiety, Communication: Assertiveness and Coping  Interventions: Solution Focused, Strength-based, Assertiveness Training and Reframing  Summary: Renee Mcclain is a 18 y.o. female who presents with a more positive mood as evidenced by smiling, laughing and a generally overall happier disposition. Renee Mcclain shared some "fun facts about herself during the group and  Indicated that her father had an argument with her roommate regarding smoking in the apartment. Renee Mcclain expressed being happy about the argument but reported that she did not think it a good time to discuss with her dad the possibility that she would need to move back home. Renee Mcclain discussed her criminal issues after an exercise on mindfulness. Renee Mcclain reported that she had contacted her attorney and she is focusing on "getting things done."     Suicidal/Homicidal: Nowithout intent/plan    Therapist Response: Therapist gave feedback on the conversation between Renee Mcclain, her father and her roommate. Therapist encouraged her to discuss with her roommate the pros and cons she had developed and to address the issue of being uncomfortable with moving the roommate's boyfriend into the apartment in January. Therapist reassured her that having a "heart to heart" talk with her roommate may delay the need to move. Therapist praised her awareness of her own needs, feelings and emotions surrounding the situation. Therapist praised her for being proactive about contacting her lawyer with questions about her case.    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

## 2014-10-25 ENCOUNTER — Other Ambulatory Visit (HOSPITAL_COMMUNITY): Payer: BLUE CROSS/BLUE SHIELD | Admitting: Licensed Clinical Social Worker

## 2014-10-25 DIAGNOSIS — F3162 Bipolar disorder, current episode mixed, moderate: Secondary | ICD-10-CM

## 2014-10-25 NOTE — Psych (Signed)
[facility]  Renee GleeSarah E Mcclain [id number]  Session Time: 11:00 AM-12:30 PM Participation Level: Active  Behavioral Response: CasualAlertDepressed, Dysphoric and Irritable  Type of Therapy: Process Group  Treatment Goals addressed: Anger, Anxiety, Coping and Diagnosis: Bipolar  Interventions: Supportive and Other: Coping, Psychoeducation-Medication, Client's diagnosis  Summary: Renee GleeSarah E Mcclain is a 18 y.o. female who presents to therapist relating that she is miserable and tearful. She relates that "the medications are making it worse". She never had the mood swings like she has now. She will have a good day but then miserable again. She is exhausted and and can't keep anything down. It is frustrating because she would mostly have good days and now very rarely. She has been throwing up bile Throwing up bile for three days. She is light headed and isn't sleeping at all. She was getting better and now it is back to getting worse. She can get out of bed but is still miserable. The throwing up went away for a week now it is back again. She denies any underlying depressive thoughts or stressors that are causing her to throw up. Prior to hospitalization her pattern was to throw up every day but she was able to sleep with the Klonopin. After the hospital she was throwing up every couple of days. She has had problems with sleep and throwing up for the past year until she was put on the Klonopin to help her sleep. She is frustrated because her experience with healthcare is that she is willing to get help but then no one figures out what is wrong to help her feel better. She feels that at least the pot was working. She is thinking of stopping medications. Therapist pointed out her impulsivity in making that decision and reviewed that she has not been here that long to decide nothing is working. We reviewed how this could be possibility in the spectrum of bipolar and that she needs to  be patient about working with the doctor. We also looked up side effects of her medication and that this could be the reason for her symptoms. We discussed whether her issues could be related to stomach problems. She relates she has been to a gastroenterologist and has had every possible test and nothing was found. Therapist pointed that she could be stuffing issues as she has a lot of stressors in her life and throwing up is a way for her body to tell her that she is stressed. She agrees that she could be repressing stressors. We talked about what can help her feel better while she is working on medication adjustment and she said helping her sleep will help improve her symptoms dramatically. Client remains unstable with her mental health symptoms.    Suicidal/Homicidal: No. Therapist asked client if she had suicide ideation due to feeling "miserable" and she denied suicidal thoughts.    Therapist Response: Therapist is helping client to find effective coping strategies for client to manage her symptoms of depression, throwing up and lack of sleep rather then making impulsive decisions. Therapist is providing medication education and insight to her mental health symptoms so she has more insight of how to manage her symptoms is healthy ways. Therapist is providing supportive interventions.   Plan: 1.Client utilize therapy to process her feelings and to gain insight about healthy coping strategies to manage her symptoms. 2.Therapist educate client in medication management and mental health symptoms to help client gain insight as to healthy coping strategies.3.Therapist  help client gain insight to impulsivity and addictive behaviors.4.Client return to [facility].  Session Time: 1:00-2:30 PM Participation Level: Active  Behavioral Response: CasualAlertDepressed, Dysphoric and Irritable  Type of Therapy: Group Therapy   Treatment Goals addressed: Anger, Anxiety, Coping, and Diagnosis: Bipolar, substance  abuse  Interventions: Motivational Interviewing, Coping, D/A counseling  Summary: Therapist reviewed a worksheet titled "A Different Approach" that addressed impulsivity. We reviewed the recent hospitalization and her OD and went through what happened in the sequence it happened. Therapist related her concern that in stressful situations that she does not use good coping strategies. She recognizes she is impulsive. In reviewing the sequence, she realizes that substance use played a significant factor. She does not remember a lot of what happened and also relates she was abusing and taking too many Klonopin. She said she won't take them again. She does recognize frustration as a trigger, and that night she was frustrated with her Dad. She realizes that she did not get the outcome she wanted from her behavior and that thinking about the outcome she wants will help him slow down her impulsivity. She also said that she will slow herself down and take a minute to think about pros and cons. Therapist pointed that she has to take a minute to take a step back to get herself out of the emotion so that she can think things through rationally. Renee Mcclain also explained that she was on the path where she was continuing to make bad choices that factored into what happened. She said that she did not have any consequences for her choices so that affected what happened as well. Renee Mcclain is showing insight about her abuse of substances as well as relating to therapist that does not want to take them again. We talked about whether she will return to smoking marijuana. She explains that her friends smoked pot and social contacts at [employer] used marijuana. She has legal issues and relates to therapist she does not intend of returning to usage. She needs something though to help her sleep.   Suicidal/Homicidal: No   Therapist Response: Therapist provided input to help client recognize her problem with abusing substances as a  significant factor in what led to hospitalization and treatment and identifying substance abuse as part of what she needs to address in treatment. Therapist worked with client to identify chain of events before impulsive acts, triggers, looking at whether the outcome is what she wanted and formulating alternative strategies to acting impulsively.  Plan: 1.Client will work on healthier ways to manage trigger of frustration through implementing healthier coping strategies than impulsivity.2.Therapist will work with client on building insight to substance abuse issues.3.Client will return to PHP.  Session Time: 2:45 PM-4:00 PM  Participation Level: Active  Behavioral Response: CasualAlertDysphoric and then calm  Type of Therapy: Group Therapy, Activity Group   Treatment Goals addressed: Anxiety, Coping, and Diagnosis: Bipolar, substance abuse  Interventions: Strength Based, Coping, D/A counseling, Relapse Prevention  Summary: Therapist talked to client about plan for treatment following discharge from [facility]. At first client was upset as she feels this is one of the first times she has opened up in therapy. Therapist explained that she is recommending [facility] and gave client a brochure to explain the program. Therapist explained that she believed that client needed treatment to work on substance abuse issues in addition to her mental health issues. She also needed a step down so that she would continue to get the support of a program. After client  talked to therapist she was more open to the recommendation. She is going to bring the program handout to her lawyer as she realizes that this might be something she needs to do for her legal issues as well. We discussed that this program would allow her to get help for her mental health issues along with her substance abuse. Client said she will review with her family and lawyer and think about what is the best option for her.  After reviewing discharge plan we  reviewed general coping tips for mental health disorders. She agreed to journal about one positive event for the day. Therapist explained that this would improve mood. We reviewed a worksheet on "My Personal goals for Recovery". She related that recovery for her meant at this point sleeping, eating and being on a better schedule. In terms of what needed to change she related that she was not going to take benzodiazepines and wanted to be in a new living situation but was not going to happen right away. She said she would be 10 times better if she was able to sleep. Client remains unstable with mental health symptoms and before she is able to start to take on bigger recovery goals, she has to obtain stability with symptoms.  Client completed a personal coat of arms diagram where she was able to share different parts of her life, personality and family. This was a positive activity for her to build self-esteem. Client set a goal for today to see her attorney  Suicidal/Homicidal: No  Therapist Response: Therapist worked with client on aftercare plan. Therapist worked with client on coping strategies for mental health including journal writing and setting a goal for the day. Therapist worked with client on personal goals for her recovery and changes she needs to make. Client still needs to stabilize with her mental health. Therapist worked with client on self-esteem building exercise.  Plan:1.Client apply coping strategies to manage her mental health.2.Client work on journal entry and completing goal she set for today.3.Therapist apply supportive interventions.[facility]  Diagnosis: No Primary Diagnosis found   No diagnosis found.    Keno Caraway A 10/25/2014

## 2014-10-26 ENCOUNTER — Other Ambulatory Visit (HOSPITAL_COMMUNITY): Payer: BLUE CROSS/BLUE SHIELD | Admitting: Licensed Clinical Social Worker

## 2014-10-26 ENCOUNTER — Encounter (HOSPITAL_COMMUNITY): Payer: Self-pay | Admitting: Licensed Clinical Social Worker

## 2014-10-26 ENCOUNTER — Ambulatory Visit (HOSPITAL_COMMUNITY): Payer: Self-pay | Admitting: Licensed Clinical Social Worker

## 2014-10-26 DIAGNOSIS — H9325 Central auditory processing disorder: Secondary | ICD-10-CM

## 2014-10-26 DIAGNOSIS — F39 Unspecified mood [affective] disorder: Secondary | ICD-10-CM

## 2014-10-26 DIAGNOSIS — F3162 Bipolar disorder, current episode mixed, moderate: Secondary | ICD-10-CM | POA: Diagnosis not present

## 2014-10-26 MED ORDER — LAMOTRIGINE 25 MG PO TABS: 25.0000 mg | ORAL_TABLET | Freq: Every day | ORAL | Status: DC

## 2014-10-26 MED ORDER — OLANZAPINE 2.5 MG PO TABS: 2.5000 mg | ORAL_TABLET | Freq: Every day | ORAL | Status: DC

## 2014-10-26 NOTE — Psych (Signed)
[facility]  Progress Note- duration 40 minutes   Date: 10/26/2014 Name: Renee GleeSarah E Nesby MRN: [id number]    Subjective: Patient states she has not been doing well over recent days. She has been more nauseous, has been vomiting often, several times yesterday, and has had diarrhea.  Objective: Patient is reporting chronic GI symptoms- these are mostly vomiting and diarrhea. She has had these for several months at least, and had a fairly significant gastroenterology work up which included a colonoscopy, an endoscopy and " blood work". States she was informed that all of these tests had been negative/normal, and that her GI symptoms could be related to stress. It is unclear if nausea, vomiting is entirely  psychogenic, as patient also reports weight loss of 30-40 lbs over recent months, and vague symptoms such as feeling vaguely physically ill, diaphoretic at times.  With her express consent I spoke with the MD covering for her PCP, Dr. Katrinka BlazingSmith- we reviewed case as above - They will see her soon to re-evaluate, and  She has made appointment for early January/16. She will also go by GI Clinic to get transcripts of blood work and endoscopies results to take to PCP office. Of note, patient has a TSH done by PCP from 10-11 months ago which was within normal limits Patient is unsure if Abilify could be related to  GI symptoms, as has been on this medication for several weeks, and vomiting has been chronic although " on and off". She does feel it has been getting worse.She does not feel Abilify is effectively addressing her mood which she continues to report as anxious and depressed. We have reviewed history again, and with patient's express consent I have obtained information from her mother as well. As per mother, patient has had behavioral issues over the last few years, and confirms there is a strong family history of bipolar disorder. Patient tends to be impulsive , easily  irritated, emotional. Patient does state she has had distinct episodes of hypomania, with decreased need for sleep, decreased inhibitions, increased irritability, and racing thoughts. She states " I do believe I am bipolar, it fits me to a T". As discussed with PHP therapists, she has been going to groups and seems motivated in treatment. Patient denies any drug abuse or alcohol use since her psychiatric admission   Outpatient Encounter Prescriptions as of 10/26/2014  Medication Sig Note  . albuterol (PROVENTIL HFA;VENTOLIN HFA) 108 (90 BASE) MCG/ACT inhaler Inhale 1 puff into the lungs every 6 (six) hours as needed for wheezing or shortness of breath (shortness of breath).   Marland Kitchen. desogestrel-ethinyl estradiol (KARIVA,AZURETTE,MIRCETTE) 0.15-0.02/0.01 MG (21/5) tablet Take 1 tablet by mouth daily.   . hydrOXYzine (ATARAX/VISTARIL) 25 MG tablet Take 1 tablet (25 mg total) by mouth 3 (three) times daily as needed. (Patient not taking: Reported on 10/12/2014)   . hydrOXYzine (ATARAX/VISTARIL) 50 MG tablet Take 1 tablet (50 mg total) by mouth at bedtime.   . lamoTRIgine (LAMICTAL) 25 MG tablet Take 1 tablet (25 mg total) by mouth daily.   . Multiple Vitamin (MULTIVITAMIN WITH MINERALS) TABS tablet Take 1 tablet by mouth daily. (Patient not taking: Reported on 10/12/2014)   . OLANZapine (ZYPREXA) 2.5 MG tablet Take 1 tablet (2.5 mg total) by mouth at bedtime.   . ondansetron (ZOFRAN) 4 MG tablet Take 4 mg by mouth every 4 (four) hours as needed for nausea or vomiting (nausea).   . [DISCONTINUED] ARIPiprazole (ABILIFY) 5 MG tablet Take 1 tablet (  5 mg total) by mouth at bedtime. 10/26/2014: nausea, vomiting, although unclear if directly resulting from Abilify trial   Allergies  Allergen Reactions  . Azithromycin Hives      Review of Systems Denies headaches, states that friends have told her " eyes look big", but denies any actual visual issues, denies chest pain, denies SOB, denies coughing, no  abdominal pain, (+) nausea, (+) vomiting, (+) diarrhea, denies melenas or hematemesis, denies urgency, denies dysuria, denies rash, (+) significant weight loss over recent months      Vitals  130/79, pulse 86, weight 157.2    Mental Status Exam: Appearance:  Well groomed Psychomotor::  Within Normal Limits Attention span and concentration: Normal Behavior: calm Speech:  normal pitch and normal volume Mood:  depressed Affect:  normal and slightly anxious and constricted, but reactive  Thought Process:  goal directed and within normal limits Thought Content:  not suicidal, not homicidal and no psychotic symptoms  Orientation:  Full  Cognition:  grossly intact Insight:  Intact Judgment:  Fair Estimate of Intelligence: Average Fund of knowledge: Aware of current events Memory: Recent and remote intact Abnormal movements: None Gait and station: Normal  Assessment: Patient describes ongoing mood instability and insomnia, but is not presenting with any clear symptoms of hypomania or mania at this time- no pressured speech, no irritability, no flight of ideations, no grandiosity. She describes increased GI symptoms, namely diarrhea and vomiting , which has been a waxing and waning but chronic issue for several months, with negative work up by [employer].  Although it is not clear that Abilify is causing or worsening GI symptoms, we did agree to D/C as she feels it is not working for her. We discussed medication alternatives as below.   Diagnosis: Primary Diagnosis: Bipolar 1 disorder, mixed, moderate [F31.62] 1. Bipolar 1 disorder, mixed, moderate       Plan: Continue PHP Family meeting tentatively planned for early January, with parents D/C Abilify Start Zyprexa 2.5 mgrs QHS,  Start Lamictal 25 mgrs QDAY- we reviewed side effects and rationale- prefer to start Zyprexa at low dose due to patient's history of developing side effects to several medications in the past . Request  TSH, and UDS.    Marland Kitchen.    Nehemiah MassedOBOS, FERNANDO, MD

## 2014-10-26 NOTE — Psych (Signed)
   [facility]  Renee GleeSarah E Mcclain [id number]  Session Time: 2:30pm-4:00pm  Participation Level: Active  Behavioral Response: NeatAlertAnxious, Depressed and Hopeless  Type of Therapy: Group Therapy  Treatment Goals addressed: Anxiety, Communication: Effective and Coping  Interventions: CBT, Motivational Interviewing, Supportive and Family Systems  Summary: Renee Mcclain is a 18 y.o. female who presents with tearful, frustrated affect. Renee Mcclain reported that she had an argument with her dad on the way to group today. She reported that dad informed her that he is "not her taxi" and if she didn't "quit crying" and "suck it up" that he would put her back into the hospital. Renee Mcclain states that she feels hopeless. She reports that her parents blame her for everything that is associated with her being in the hospital. She states that it makes her really depressed and also stated " I don't want to kill myself but I don't want to live like this either." She reports that her mother has "washed her hands" of her and doesn't argue or say anything to her. She states that her dad is not listening when she attempts to relay her feelings and responds by stating " I've done my research on this and I know you can control this. I've seen my mom got through all of this. Toughen up!"    Suicidal/Homicidal: Negativewithout intent/plan   Therapist Response:  (1)  Pt was aided in further developing self-awareness and insight into present conflict (2) Pt continues to explore options regarding living arrangements (3) Pt was probed to identify feelings regarding a recent conflict with her father (4) Pt was taught coping skills and self-soothing techniques (5) Pt was taught relaxation techniques (6) Therapist demonstrated a control circle technique to assist pt with processing most recent conflict  Plan: (1) [facility] (2) Therapist will contact insurance company  with updates on treatment (3) Pt will complete assignment before returning to PHP  (4) Patient will continue to take medications as directed   Diagnosis:   F39     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

## 2014-10-26 NOTE — Psych (Signed)
[facility]  Renee GleeSarah E Mcclain [id number]  Session Time: 11:00-12:30 PM  Participation Level: Active  Behavioral Response: CasualAlertDepressed and Irritable  Type of Therapy: Process Group  Treatment Goals addressed: Anger, Anxiety, Communication: With Parents, Coping and Diagnosis: Bipolar  Interventions: Supportive, Family Systems and Other: Psychosocial Development-Identity Formation  Summary: Renee GleeSarah E Mcclain is a 18 y.o. female who presents tearful and also frustrated. She explains that she got in an argument with her Dad yesterday and he is not listening or relating to her. He only sees things from his perspective and he is not paying attention. She feels stuck because she is reliant on them financially so she has to comply with their wishes even though she is doing what makes her happy. She is not yet able to figure out what makes her happy but she has been stuck and unhappy because she is doing what makes them happy. Therapist encouraged family meeting to share how she is feeling so her parents could better understand and could try to be more supportive. Client was at first resistant but then said she would have the meeting although she is not sure if it would accomplish anything. Client first needs to be stabilized with her symptoms. Once she is more stable, she needs to take steps to identify formation. This includes taking time to figure out what will make her happy. The therapist will work with client on helping the family communicate more effectively to change family patterns that client achieve greater harmony beteen individual and family needs. Healthy communication patterns will also assist the client in identify formation. I think some of client's symptoms are also a result not only of conflict with the family but also because she is not stable with her mental health. She was taken off of Abilify yesterday per report and she is still working with the  doctor on medication management.     Suicidal/Homicidal: No  Therapist Response: Therapist is providing supportive interventions, and encouraging healthier communication pattern with the family. Therapist is also working with client on early stages of identity formation. Therapist is working with client on effective coping strategies for her mental health.   Plan: 1.Therapist provide supportive interventions while client utilizes effective coping strategies for mental health.2.Therapist work with client on better communication with family and on helping change the dynamic to create a healthier family system 3.Client work on medication management with her doctor.4.Client return to [facility].  Session Time: 1:00-2:30 PM  Participation Level: Active  Behavioral Response: CasualAlertDepressedIrritable and end of session calm  Type of Therapy: Pscychoeducation  Treatment Goals addressed: Anger, Anxiety, Communication: With Parents, Coping and Diagnosis: Bipolar  Interventions: Supportive, Family Systems and Other: Psychosocial Development-Identity Formation  Summary: Client remains tearful about interaction with Dad. She feels she is blamed for things that there is nothing she can do anything about. We reviewed exercise about things she can and can't control. We discussed how her Dad can control his perceptions and attitudes but that all she has control over is herself. Therapist discussed how a family meeting will help him be better able to meet her where she in her own struggles but also therapist wanted client to have some perception that her father's frustration is related to what he is going through. A family meeting may help him recognize the impact his attitudes and perceptions have on her that change have a positive impact on family system. Therapist also reinforced an earlier insight that client has control through  perceptions and attitude to channel energy in constructive ways. Client shares  frustrations of not feeling she is doing better and therapist pointed out that therapy is a process and help client with a more realistic expectation that change and growth can be a struggle and take time. Also, to recognize the insights and growth she will get from the experience.   Homicidal/Suicidal: No  Therapist Response: Therapist is working on insight building for client to process of improvement in therapy as a supportive intervention, using CBT strategies to help with client's perceptions and coping strategies and helping client gain insight to ways to improve communication to help improve dysfunction in family interaction.   1.Therapist work with client on developing healthier coping strategies and client implement coping strategies to manage stressors and mental health symptoms.2.Client journal on implementing a healthy coping strategy.3.Therapist work with client on implementing healthy strategies to change unhealthy dynamics in family.4.Client return to PHP  Diagnosis: Primary Diagnosis: Bipolar 1 disorder, mixed, moderate [F31.62]    1. Bipolar 1 disorder, mixed, moderate       Bowman,[name] 10/26/2014

## 2014-10-27 ENCOUNTER — Ambulatory Visit (HOSPITAL_COMMUNITY): Payer: Self-pay | Admitting: Licensed Clinical Social Worker

## 2014-10-27 ENCOUNTER — Other Ambulatory Visit (HOSPITAL_COMMUNITY): Payer: BLUE CROSS/BLUE SHIELD | Admitting: Licensed Clinical Social Worker

## 2014-10-27 ENCOUNTER — Telehealth (HOSPITAL_COMMUNITY): Payer: Self-pay | Admitting: Licensed Clinical Social Worker

## 2014-10-27 DIAGNOSIS — H9325 Central auditory processing disorder: Secondary | ICD-10-CM

## 2014-10-27 DIAGNOSIS — F1220 Cannabis dependence, uncomplicated: Secondary | ICD-10-CM

## 2014-10-27 DIAGNOSIS — F3162 Bipolar disorder, current episode mixed, moderate: Secondary | ICD-10-CM

## 2014-10-27 DIAGNOSIS — F39 Unspecified mood [affective] disorder: Secondary | ICD-10-CM

## 2014-10-27 NOTE — Psych (Signed)
[facility]  Renee GleeSarah E Mcclain [id number]  Session Time: 1:00-2:30 PM  Participation Level: Minimal  Behavioral Response: CasualDrowsy and Lethargic  Type of Therapy: Group Therapy  Treatment Goals addressed: Anger, Anxiety, Communication: With parents, Coping and Diagnosis: Bipolar, Cannabis Dependence  Interventions: Family Systems and Other: Coping Stratgies, Psychoeducation  Summary: Renee GleeSarah E Stefanik is a 18 y.o. female who presents falling asleep in group and when she wakes up drowsy with minimum participation. Therapist presented a reading titled "Holiday Triggers". The reading provided more insight about what could be a trigger such as feelings and memories. There can be an innocuous event that triggers memories. The reading also offered insight as to healthy ways to manage. Renee Mcclain could not identify any triggers. She related that she only got two hours of sleep last night. She did not accomplish anything with the attorney, got into a fight with her Dad and that her boyfriend came over at 3:00 AM in the morning. She said that she let him sleep on the couch. Therapist offered feedback to encourage her to continue to make healthy choices as far as her relationship. Therapist also reminded her of coping strategies in managing conflict with her Dad. Renee Mcclain still remains unstable in mood and unable to sleep.    Suicidal/Homicidal: No  Therapist Response: Therapist provided worksheet so client could reflect on triggers for the holiday and be better prepared. Therapist offered strategies to prepare for holidays and coping strategies related to her relationship and with conflict with her Dad.  Plan: 1.Client utilize coping strategies to manage relationship with parents and to help her in making good choices with her past relationship.2.Therapist educate client on healthy coping strategies.[facility]  Session Time: 2:30-4:00 PM  Participation Level:  Minimal  Behavioral Response: CasualDrowsy and Lethargic  Type of Therapy: Process Group  Treatment Goals addressed: Anger, Anxiety, Communication: With parents, Coping and Diagnosis: Bipolar, Cannabis Dependence  Interventions: Family Systems and Other: Coping Stratgies, Psychosocial Development  Summary: Client remained drowsy for this second group and continuing fell asleep. She had only two hours of sleep last night and remains unstable with her mental health symptoms. The group offered supportive interventions related to conflict with parents. Client acknowledges that she does not know what she is going to do next and has only done things to please her parents. She was encouraged to try different things and that she is at an age where she can do since her parents are supportive. Another group member is going through a similar period of taking time to figure things out and also managing conflict with his parents is supportive for client in relating and validating her struggles and what she is going through. She was encouraged to see that figuring things out takes time, but that she needs to take time to figure things out now rather than letting time go by and being at the same place in years to come. She was also given supportive feedback about healthy ways to manage conflict with her parents. I think group process offered supportive interventions in the struggles and uncertainty she is going through now.   Suicidal/Homicidal: No  Therapist Response: Therapist is offering supportive feedback to normalize client's experience of uncertainty as being developmentally appropriate. Therapist utilize the group process to encourage good coping strategies and to validate client's experience.   Plan: 1.Client utilize the group process for support and gain insight to healthy coping strategies.2.Client utilize healthy coping strategies to manage stressors  in her life.3.Therapist provide supportive  interventions and feedback to healthy coping strategies.[facility]   Diagnosis: Primary Diagnosis: Bipolar 1 disorder, mixed, moderate [F31.62]    1. Bipolar 1 disorder, mixed, moderate   2. Cannabis dependence       Bowman,[name] 10/27/2014

## 2014-10-27 NOTE — Psych (Addendum)
   [facility]  Renee Mcclain [id number]  Session Time: 11:00am-1:00pm  Participation Level: Minimal  Behavioral Response: NeatDrowsyAnxious and Irritable  Type of Therapy: Group Therapy  Treatment Goals addressed: Anxiety, Communication: Assertive and Coping  Interventions: CBT, Solution Focused, Assertiveness Training and Supportive  Summary: Renee Mcclain is a 18 y.o. female who presents with anxious distress and frustration. She stated that she visited her lawyer on yesterday and was informed by him that he has not begun any of the work she'd expected. She expressed frustration because she reported feeling as if the attorney did not answer any of her questions regarding her case. According to her, her father became upset with her and she attempted to walk home from the lawyer's house. She stated that her mom followed her and convinced her to get into the car. Mom and dad came into her house and took all of the medications. She stated " why are you all doing this now? You didn't do any of this when I was in the hospital?" She  Says her parents are going overboard now when they are upset with her. She informed them that she did not want to speak with them until the family session. She reports that she said to her dad "dont take out your frustrations on me?" to which he added, "you haven't seen frustration yet!" She states that although she would categorize last night as "bad" that she was able to get some rest and eat without vomiting for the first time in weeks.   Suicidal/Homicidal: Negativewithout intent/plan  Therapist Response:  (1)  Therapist acknowledged patient's frustrations and offered input to assist pt with processing solutions instead of complaining  (2) Therapist allowed pt to ventilate her frustrations about her family and the attorney's performance (3) Therapist assigned her the task of asking questions by email to assure a more timely  communication with the attorney (4) Therapist accessed for trigger words which would indicate SI or severe depression   Plan:  (1) [facility] (2) Pt will complete assignment before returning to PHP  (3) Patient will continue to take medications as directed (4) Pt will meet with Dr.Cobos to evaluate medications to see if they are the cause of her increased drowsiness  Diagnosis:  F39     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

## 2014-10-28 ENCOUNTER — Other Ambulatory Visit (HOSPITAL_COMMUNITY): Payer: BLUE CROSS/BLUE SHIELD | Admitting: Licensed Clinical Social Worker

## 2014-10-28 DIAGNOSIS — F1220 Cannabis dependence, uncomplicated: Secondary | ICD-10-CM

## 2014-10-28 DIAGNOSIS — F3162 Bipolar disorder, current episode mixed, moderate: Secondary | ICD-10-CM

## 2014-10-28 NOTE — Psych (Signed)
   [facility]  Renee Mcclain [id number]  Session Time: 11:00 AM-12:00 PM  Participation Level: Active  Behavioral Response: CasualAlertEuphoric  Type of Therapy: Process Group  Treatment Goals addressed: Anger, Anxiety, Communication: With parents, Coping and Diagnosis: Bipolar, Cannabis Dependence  Interventions: Motivational Interviewing, Family Systems and Other: Coping, Pscyoeducation-Healthy Relationship  Summary: Renee Mcclain is a 18 y.o. female who presents with bright mood. She reports she slept well although getting sick. She relates that she did not vomit over night. She relates that her boyfriend is coming back into the picture. He showed up drinking at 3:00 AM two nights ago and last night kept calling and texting her at 4:00 AM in the morning. She is not sure what to do so she went to the board and did a pro and con list. From the list it is clear she still loves him. She described the relationship as unhealthy but that he is willing to change. He has been going to counseling since last July. Therapist educated client briefly about addiction and how it is a progressive disease. If he is drinking, because of the nature of the disease, he could return to drinking everyday as he was before. Client has grown up with people drinking so she doesn't expect him never to drink again. Therapist input was to look for warning signs and understanding addiction to give both of them time to get back into the relationship to make sure they have really changed. If they jump too quickly back into a relationship, she has not made sure the changes are going to last and they have not  given themselves the time they need to work on themselves. Renee Mcclain presents as still confused about what she wants to do about the relationship.  She discussed using good coping strategies to manage conflict with Dad that shows progress. Therapist read a passage about coping at Christmas and  the message that the group focused on and discussed was creating the holiday of my choice. We discussed ways we could enjoy the holidays and make it the holiday of our choice.       Suicidal/Homicidal: No  Therapist Response: Therapist processed clients feelings about her relationship and had client do a pro/con list to help her with her decision making. Therapist educated client on addiction to help client have more insight in making a decision about her relationship. Therapist encouraged client to take time in her decision making as it takes time to make sure that changes will last in order to return to a healthy relationship. Therapist gave client positive feedback about good coping strategies with her Dad and encouraged good coping for the holidays.   Plan: 1.Therapist continue to help client through uncertainty in relationship through insights to building a healthy relationship.2.Client continue to learn and apply healthy coping skills. 3.[facility]  Diagnosis: Primary Diagnosis: Bipolar 1 disorder, mixed, moderate [F31.62]    1. Bipolar 1 disorder, mixed, moderate   2. Cannabis dependence       Rudolf Blizard A 10/28/2014

## 2014-10-29 ENCOUNTER — Other Ambulatory Visit (HOSPITAL_COMMUNITY): Payer: Self-pay

## 2014-10-30 ENCOUNTER — Encounter (HOSPITAL_COMMUNITY): Payer: Self-pay

## 2014-11-01 ENCOUNTER — Ambulatory Visit (HOSPITAL_COMMUNITY): Payer: Self-pay | Admitting: Licensed Clinical Social Worker

## 2014-11-01 ENCOUNTER — Other Ambulatory Visit (HOSPITAL_COMMUNITY): Payer: BLUE CROSS/BLUE SHIELD | Admitting: Licensed Clinical Social Worker

## 2014-11-01 DIAGNOSIS — F39 Unspecified mood [affective] disorder: Secondary | ICD-10-CM

## 2014-11-01 DIAGNOSIS — F3162 Bipolar disorder, current episode mixed, moderate: Secondary | ICD-10-CM

## 2014-11-01 DIAGNOSIS — F1220 Cannabis dependence, uncomplicated: Secondary | ICD-10-CM

## 2014-11-01 DIAGNOSIS — H9325 Central auditory processing disorder: Secondary | ICD-10-CM
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
## 2014-11-02 ENCOUNTER — Other Ambulatory Visit (HOSPITAL_COMMUNITY): Payer: BLUE CROSS/BLUE SHIELD | Admitting: Licensed Clinical Social Worker

## 2014-11-02 ENCOUNTER — Ambulatory Visit (HOSPITAL_COMMUNITY): Payer: Self-pay | Admitting: Licensed Clinical Social Worker

## 2014-11-02 DIAGNOSIS — F3162 Bipolar disorder, current episode mixed, moderate: Secondary | ICD-10-CM

## 2014-11-02 DIAGNOSIS — F39 Unspecified mood [affective] disorder: Secondary | ICD-10-CM

## 2014-11-02 DIAGNOSIS — H9325 Central auditory processing disorder: Secondary | ICD-10-CM

## 2014-11-02 DIAGNOSIS — F1220 Cannabis dependence, uncomplicated: Secondary | ICD-10-CM
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
## 2014-11-03 ENCOUNTER — Other Ambulatory Visit (HOSPITAL_COMMUNITY): Payer: Self-pay

## 2014-11-04 ENCOUNTER — Other Ambulatory Visit (HOSPITAL_COMMUNITY): Payer: BC Managed Care – PPO | Admitting: Licensed Clinical Social Worker

## 2014-11-04 ENCOUNTER — Ambulatory Visit (HOSPITAL_COMMUNITY): Payer: Self-pay | Admitting: Licensed Clinical Social Worker

## 2014-11-04 ENCOUNTER — Telehealth (HOSPITAL_COMMUNITY): Payer: Self-pay | Admitting: Licensed Clinical Social Worker

## 2014-11-04 DIAGNOSIS — F3162 Bipolar disorder, current episode mixed, moderate: Secondary | ICD-10-CM

## 2014-11-04 DIAGNOSIS — H9325 Central auditory processing disorder: Secondary | ICD-10-CM

## 2014-11-04 DIAGNOSIS — F1220 Cannabis dependence, uncomplicated: Secondary | ICD-10-CM

## 2014-11-04 DIAGNOSIS — F39 Unspecified mood [affective] disorder: Secondary | ICD-10-CM
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
## 2014-11-08 ENCOUNTER — Ambulatory Visit (HOSPITAL_COMMUNITY): Payer: Self-pay | Admitting: Licensed Clinical Social Worker

## 2014-11-08 ENCOUNTER — Other Ambulatory Visit (HOSPITAL_COMMUNITY): Payer: BLUE CROSS/BLUE SHIELD | Attending: Psychiatry

## 2014-11-08 DIAGNOSIS — H9325 Central auditory processing disorder: Secondary | ICD-10-CM

## 2014-11-08 DIAGNOSIS — F1220 Cannabis dependence, uncomplicated: Secondary | ICD-10-CM

## 2014-11-08 DIAGNOSIS — F3162 Bipolar disorder, current episode mixed, moderate: Secondary | ICD-10-CM

## 2014-11-08 DIAGNOSIS — F39 Unspecified mood [affective] disorder: Secondary | ICD-10-CM

## 2014-11-08 NOTE — Psych (Signed)
[facility] BH PHP THERAPIST PROGRESS NOTE  Renee Mcclain [id number]  Session Time: 11:00-1:15  Participation Level: Active  Behavioral Response: Fairly GroomedAlertAnxious, Irritable and Excited  Type of Therapy: Group Therapy  Treatment Goals addressed: Anger, Anxiety, Communication: Family and Coping  Interventions: CBT, Solution Focused, Assertiveness Training and Family Systems  Summary: Renee Mcclain is a 19 y.o. female who presents with improved mood. [name] stated that she received her driver's license on Friday and that she will be able to drive until her court date on January 12th .  She stated that her dad had given her a "lecture" about driving and wanted to be sure that she did not make the same mistakes with drinking. She reports that she still has to perform community service. She also states that her attorney isn't doing his job and her dad has promised to secure another attorney if the present one continues not to work out. [name] reports feeling a bit anxious because she has no communication from the attorney and she is worried that she may have to spend three days in jail if he is unable to have the charges dropped.   Suicidal/Homicidal: Negativewithout intent/plan  Therapist Response:  (1) Therapist actively listed to patient's recounting of her anxiety producing situation (2) Pt was aided in addressing worries and fears regarding present issues (3) Pt was probed to identify feelings regarding upcoming court appearance (4) Therapist utilized assertiveness training to teach the pt self-advocacy  Plan:  (1)[facility] will [facility] (2) Pt will complete assignment before returning to PHP  (3) Patient will continue to take medications as directed (4) Pt will remind family of upcoming family therapy appointment   Diagnosis:  F39     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  Anxiety state, unspecified  H93.25 Auditory processing disorder    Denies substance  use; no active SI / HI / AVH   [name], LCSW 11/08/2014                        [facility] BH PHP THERAPIST PROGRESS NOTE  ALONI CHUANG [id number]  Session Time: 1:15-2:45  Participation Level: Active  Behavioral Response: Fairly GroomedAlertAnxious, Irritable and Excited  Type of Therapy: Group Therapy  Treatment Goals addressed: Anger, Anxiety, Communication: Family and Coping  Interventions: CBT, Solution Focused, Assertiveness Training and Family Systems  Summary: Renee Mcclain is a 19 y.o. female who presents with anxiety symptomology. [place] states that she and her parents sat with her roommate after the argument last week and discussed a plan for then both to "get along." Brogan expressed hesitation with wanting to improve communication with her roommate but agreed that she would try as she did not have many other options for where she able to live. She and mom were able to talk after dad insisted they speak to each other. She stated that her family is full of "miscommunication" and she does not think it will improve because that's just "the way it is". She says her parents have expressed being "disappointed" in her and feels she now understands why some of their arguments have gotten out of hand. She states that after texting a "thank you for all you've done" to her grandfather that her relationship with him has improved as well.   Suicidal/Homicidal: Negativewithout intent/plan  Therapist Response:  (1)  Therapist role-played communication strategies with the pt (2)  Therapist utilized empathetic understanding for situations in which the patient  felt were unfair (3) Pt was probed to identify feelings regarding the incident (4) Pt was taught anger reducing techniques  Plan:  (1)Pt will return [facility] on 11[facility]13 (2) Pt will complete assignment before returning to PHP  (3) Patient will continue to take medications as directed (4) Pt will meet with  Dr.Cobos to evaluate medications   Diagnosis:  F39     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  Anxiety state, unspecified  H93.25 Auditory processing disorder    Denies substance use; no active SI / HI / AVH   [name], LCSW 11/08/2014                   [facility] PHP THERAPIST PROGRESS NOTE  MATAYA KILDUFF [id number]  Session Time: 2:45-4:00  Participation Level: Active  Behavioral Response: Fairly GroomedAlertAnxious, Irritable and Excited  Type of Therapy: Group Therapy  Treatment Goals addressed: Anger, Anxiety, Communication: Family and Coping  Interventions: CBT, Solution Focused, Assertiveness Training and Family Systems  Summary: Renee Mcclain is a 19 y.o. female who presents with anger and irritation towards her father. [name] states she wishes she'd had more consequences for her behavior when she was growing up. She states her parents  Do not follow through but she believes communication is improving with her increases in assertiveness. She indicated that she feels her communication with dad will never improve more than its current level because she cannot forgive him for mistakes he'd made in the past.   Suicidal/Homicidal: Negativewithout intent/plan  Therapist Response:  (1) Therapist processed the Stages of Grief  (2) Therapist probed pt's feelings toward her father (3) Therapist offered feedback regarding client's relationship with her dad (4) Therapist utilized empathetic understanding of pt's feelings  (5) Therapist problem solved strategies to improve pt communication with her father  Plan:  (1)Pt will return to [facility] on 11/09/13 (2) Pt will complete assignment before returning to PHP  (3) Patient will continue to take medications as directed (4) Pt will meet with Dr.Cobos  (5) Therapist will begin to initiate discharge procedures (6) Dr.Cobos will check on labs ordered for pt   Diagnosis:  F39     Episodic mood disorder -  Primary F41.1  Anxiety state, unspecified  H93.25 Auditory processing disorder    Denies substance use; no active SI / HI / AVH   Raydan Schlabach, LCSW 11/08/2014

## 2014-11-09 ENCOUNTER — Other Ambulatory Visit (HOSPITAL_COMMUNITY): Payer: Self-pay | Admitting: Licensed Clinical Social Worker

## 2014-11-09 ENCOUNTER — Telehealth (HOSPITAL_COMMUNITY): Payer: Self-pay | Admitting: Licensed Clinical Social Worker

## 2014-11-10 ENCOUNTER — Other Ambulatory Visit (HOSPITAL_COMMUNITY): Payer: BLUE CROSS/BLUE SHIELD | Admitting: Psychiatry

## 2014-11-10 ENCOUNTER — Ambulatory Visit (HOSPITAL_COMMUNITY): Payer: Self-pay | Admitting: Licensed Clinical Social Worker

## 2014-11-10 DIAGNOSIS — F1220 Cannabis dependence, uncomplicated: Secondary | ICD-10-CM

## 2014-11-10 DIAGNOSIS — H9325 Central auditory processing disorder: Secondary | ICD-10-CM

## 2014-11-10 DIAGNOSIS — F3162 Bipolar disorder, current episode mixed, moderate: Secondary | ICD-10-CM

## 2014-11-10 DIAGNOSIS — F39 Unspecified mood [affective] disorder: Secondary | ICD-10-CM

## 2014-11-10 MED ORDER — LAMOTRIGINE 25 MG PO TABS: 25.0000 mg | ORAL_TABLET | Freq: Three times a day (TID) | ORAL | Status: AC

## 2014-11-10 MED ORDER — OLANZAPINE 2.5 MG PO TABS: 2.5000 mg | ORAL_TABLET | Freq: Every day | ORAL | Status: AC

## 2014-11-10 NOTE — Progress Notes (Signed)
[facility]  PHP Progress Note   11/10/2014 5:32 PM PAGE PUCCIARELLI  MRN:  [id number]   Duration: 30 minutes  Subjective: Patient  Reports improvement compared to admission. She states she feels " fine most of the time" and better than she had been feeling in the past. Of note, her nausea and vomiting have resolved and has not had any recent vomiting. She feels the GI symptoms may have been associated to Abilify, as symptoms improved upon discontinuation of this medication. Objective: I have discussed case with PHP staff and have met with patient. As per staff, she is better. She has presented with overall improved mood. She has , as above, described relief related to improved GI symptoms. She is participative in groups, although compliance with groups has decreased this week. Patient states she feels current  Medications are working well for her. We reviewed medication side effects, to include risk of weight gain, metabolic disturbances and movement disorders on Zyprexa ( although these are less likely due to low dose, which seems to be effective for patient at this time). No akathisa or any abnormal involuntary movements noted. Patient states she has remained sober from cannabis and other drugs, and states her boyfriend has also stopped using, making it easier for her to abstain. She reports her relationship with her parents , although still somewhat tense, has improved. With her express consent I spoke with patient's mother on phone. Mother interested in a family meeting, which I think could be helpful , but  Debbra is ambivalent about this and states she is not currently agreeing to a family meeting, although she states she will consider it and talk to her parents about it. We have tentatively scheduled family meeting time for tomorrow in case she does agree to have this session. Patient has returned to work and states she is doing well at work at this time. Regarding labs, states she has not had them  done yet, but mother - via phone- has confirmed TSH /thyroid function tests were done late last year at request of her GI MD. Patient agrees to pick up copy of results for our file. She is discussing disposition options with staff- currently considering transitioning to IOP versus medication management and individual psychotherapy referral. Diagnosis:  Bipolar Spectrum Disorder, Cannabis Dependence    ADL's: improved   Sleep: States she is sleeping better   Appetite:  Improved , and as noted, nausea and vomiting have abated   Suicidal Ideation:  Denies Homicidal Ideation:  Denies AEB (as evidenced by):  Psychiatric Specialty Exam: Physical Exam  ROS- no vomiting, no nausea, no diarrhea, no abdominal pain, no akathisia, no abnormal involuntary movements  There were no vitals taken for this visit.There is no weight on file to calculate BMI.  General Appearance: Casual  Eye Contact::  Good  Speech:  Clear and Coherent and Normal Rate  Volume:  Normal  Mood:  reports mood as improved, denies depression at present   Affect:  Appropriate and reactive  Thought Process:  Coherent, Goal Directed and Intact  Orientation:  Full (Time, Place, and Person)  Thought Content:  no hallucinations, no delusions   Suicidal Thoughts:  No denies any self injurious ideations  Homicidal Thoughts:  No  Memory:  Recent and Remote grossly intact  Judgement:  Other:  improved   Insight:  Improved   Psychomotor Activity:  Normal  Concentration:  Good  Recall:  NA  Fund of Knowledge:Good  Language: Good  Akathisia:  NA  Handed:  Right  AIMS (if indicated):     Assets:  Desire for Improvement  Sleep:      Musculoskeletal: Strength & Muscle Tone: within normal limits- no distal tremors , no diaphoresis, no psychomotor restlessness.  Gait & Station: normal Patient leans: N/A  Current Medications: Current Outpatient Prescriptions  Medication Sig Dispense Refill  . albuterol (PROVENTIL  HFA;VENTOLIN HFA) 108 (90 BASE) MCG/ACT inhaler Inhale 1 puff into the lungs every 6 (six) hours as needed for wheezing or shortness of breath (shortness of breath).    Marland Kitchen desogestrel-ethinyl estradiol (KARIVA,AZURETTE,MIRCETTE) 0.15-0.02/0.01 MG (21/5) tablet Take 1 tablet by mouth daily. 1 Package 11  . hydrOXYzine (ATARAX/VISTARIL) 25 MG tablet Take 1 tablet (25 mg total) by mouth 3 (three) times daily as needed. (Patient not taking: Reported on 10/12/2014) 90 tablet 0  . hydrOXYzine (ATARAX/VISTARIL) 50 MG tablet Take 1 tablet (50 mg total) by mouth at bedtime. 30 tablet 0  . lamoTRIgine (LAMICTAL) 25 MG tablet Take 1 tablet (25 mg total) by mouth 3 (three) times daily. Two tablets in the morning and one tablet in the evening 63 tablet 0  . Multiple Vitamin (MULTIVITAMIN WITH MINERALS) TABS tablet Take 1 tablet by mouth daily. (Patient not taking: Reported on 10/12/2014)    . OLANZapine (ZYPREXA) 2.5 MG tablet Take 1 tablet (2.5 mg total) by mouth at bedtime. 21 tablet 0  . ondansetron (ZOFRAN) 4 MG tablet Take 4 mg by mouth every 4 (four) hours as needed for nausea or vomiting (nausea).     No current facility-administered medications for this visit.    Lab Results: None available at this time- see above   Assessment: Patient  Continues to improve, and presents with improved mood, decreased emotional lability. Reports improvement of chronic GI symptoms, which may have been medication induced. Tolerating current meds well. Although improved relationship with parents, still some tension, and she is clearly ambivalent about having a family meeting at this time.    Plan:  Continue PHP- currently progressing towards discharge soon. Family meeting with mother Cato Mulligan if patient agrees Increase Lamictal to 25 mgrs QAM and 50 mgrs QHS Continue Zyprexa 2.5 mgrs QHS Have renewed medications via electronic script to pharmacy. Patient to bring in results of labs ordered by GI specialist , which she  states include a TSH.    Medical Decision Making Problem Points:  Established problem, stable/improving (1), Review of last therapy session (1) and Review of psycho-social stressors (1) Data Points:  Review of medication regiment & side effects (2)    COBOS, FERNANDO   11/10/2014, 5:32 PM

## 2014-11-10 NOTE — Psych (Signed)
   [facility]  Renee GleeSarah E Mcclain [id number]  Session Time: 1:30-3:00  Participation Level: Minimal  Behavioral Response: Casual and Fairly GroomedLethargicNA and Anxious  Type of Therapy: Group Therapy  Treatment Goals addressed: Anxiety, Communication: Assertive and Coping  Interventions: CBT, Assertiveness Training, Social Skills Training and Reframing  Summary: Renee Mcclain is a 19 y.o. female who presents with brooding, lackadaisical, and ambivalent mood. Renee Mcclain continues not to put forth effort to participate in group.  She states that she does not feel that she needs the group anymore and that she is "getting tired" of coming because it takes her entire day. She expressed an annoyance with being asked about her behavior last week. She stated that she was just "tired" and that there was "nothing else to it". Renee Mcclain states that she is ready to "move on" to the next group. She agreed with another group member and expressed concern about not having the same doctor throughout each program. She stated she feels it would be beneficial if there were one doctor who knew about her medications and would follow her throughout the process.   Suicidal/Homicidal: Negativewithout intent/plan  Therapist Response:  (1)  Therapist has observed that she does not share as much and spends much of the group trying to fall asleep (2) Therapist made several attempts to engage Renee in conversation (3) therapist probed Renee Mcclain to identify any changes or incidents which may have recently occured (4) Therapist confronted Renee Mcclain about her change in attitude and mood (5) Therapist utilized active listening and empathetic understanding when speaking with Renee Mcclain about her week  Plan:  (1) [facility] (2) Pt will complete assignment before returning to PHP  (3) Patient will continue to take medications as directed (4) Pt will meet with Dr.Cobos to prepare for  discharge (5) Pt will meet with therapist to prepare discharge summary (6) Pt will meet with case manager to prepare Aftercare plan  (7) Pt will complete final PHP  Anxiety screener   Diagnosis:  F39     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

## 2014-11-10 NOTE — Psych (Signed)
[facility] PHP THERAPIST PROGRESS NOTE  Renee Mcclain [id number]  Session Time: 11:00 AM-12:45 PM  Participation Level: Minimal  Behavioral Response: Casual and GuardedAlertEuthymic and Lack of engagement  Type of Therapy: Process Group  Treatment Goals addressed: Anger, Anxiety, Communication: With Parents, Coping and Diagnosis: Bipolar   Interventions: Anger Management Training, Family Systems and Other: Coping strategies for Emotions  Summary: Renee Mcclain is Mcclain 19 y.o. female who presents with minimal engagement and relates that she is fine. As therapist engaged her for more information she relates that the family is starting to get bills for her stomach problems. It is bothering the amount of money that the family is spending and she hates sitting at home and wants to get back to work. She reviewed some of the issues from last week and says that there is Mcclain truce with her roommate. They both realize that they have to live together and make the most of it. She is not longer upset about the issue with her boyfriend. His work had called her but he had only slept in. Therapist wants to ensure she is not stuffing any emotions that will cause her problems. She reports that she is calm emotionally and ready to get back to work. She recognizes progress she has made in treatment. Therapist believes that she gets agitated and frustrated easily and will need to work on better coping strategies for her feelings. She seems to be minimizing issues with the family. She reports that things are good with Dad and she has talked to Mom to better communicate with her. When pressed, she related that she does not think that the family session will improve Mcclain long-standing dynamic for the better. Therapist encouraged to use strategies that had been successful with the family. Specifically, anger management strategies such as relating that communication is not productive. She is not stressed at this point about  her legal issues. She relates that the worst that can happen is going to jail. She is looking forward to returning to work.    Suicidal/Homicidal: No  Therapist Response: Therapist utilized therapeutic process to encourage client not to stuff emotions but to utilize treatment to work through them. Specifically because client emotions lead her to be easily agitated and frustrated. Client encouraged client to utilize coping strategies she has learned in conflictual situations including with family. Specifically, therapist encouraged client to utilize healthier communication strategies and anger management strategies. Therapist utilized supportive interventions  Plan: 1.Client utilize coping strategies to manage conflictual situations and emotions more effectively.2.Client utilize coping strategies to manage family interactions more effectively.   Session Time: 3:00 PM-4:00 PM  Participation Level: Minimal  Behavioral Response: Casual and GuardedAlertEuthymic  Type of Therapy: Group Therapy  Treatment Goals addressed: Anger, Anxiety, Communication: With Parents, Coping and Diagnosis: Bipolar   Interventions: Anger Management Training, Family Systems and Other: Coping strategies for Emotions  Summary: Renee Mcclain remained closed off in later part of day in group. Therapist was able to get her to open up more through her connection with another client who could relate to experiences. Earlier she said things were fine in her relationships with her family but in relating to another client she verbalized her anger and frustration with her family. She feels that they "bribe her" into doing what they want but they don't deliver on their end of the bargain. They want her to go to school and she shared how her Father didn't go to school until he was [age]. She  does not want Mcclain family meeting because she explained "what is the point". Therapist feels that it is not healthy not to deal with her feelings and at in the  future they will surface again. Although client talks about her anger, she is not utilizing the therapeutic process to process her feelings in healthy ways. I think that these feelings will surface in some way in the future and are not healthy strategies for dealing with her emotions. Client relates she is ready to discharge and return to work. Therapist reviewed CDIOP options. She relates that she can not attend due to work. Therapist discussed the importance of continuing treatment so client was agreeable to referral to outpatient.   Suicidal/Homicidal: No  Therapist Response: Therapist continues to help client recognize the unhealthy strategy of shutting down processing of emotions to manage frustration and anger. Therapist is encouraging client to work through family conflict in healthy ways. Therapist continues to engage client in therapeutic process despite client's resistance in group. Therapist discussed aftercare with client.    Plan: 1.Therapist continue to engage client to help her find ways to manage emotions in healthy ways and to manage anger toward parents.2.Therapist work with client on aftercare plan.   Diagnosis: Primary Diagnosis: Bipolar 1 disorder, mixed, moderate [F31.62]    1. Bipolar 1 disorder, mixed, moderate   2. Cannabis dependence       Bowman,Renee Mcclain 11/10/2014

## 2014-11-11 ENCOUNTER — Ambulatory Visit (HOSPITAL_COMMUNITY): Payer: Self-pay | Admitting: Licensed Clinical Social Worker

## 2014-11-11 ENCOUNTER — Telehealth (HOSPITAL_COMMUNITY): Payer: Self-pay | Admitting: Licensed Clinical Social Worker

## 2014-11-11 NOTE — Psych (Signed)
[facility] Psych Discharge [facility]  Kristopher GleeSarah E Schaberg [id number]  Admission date: 10/12/2014 Discharge date: 11/11/2013  Reason for admission: Diagnosis Bipolar Disorder NOS, Cannabis Dependence. [name] [facility] to an apparent suicide attempt by OD. She was inpatient from 10/08/14 to 10/12/14 and was referred to the Partial Hospitalization Program. She related that "I felt so hopeless I wanted to die". She related that it was impulsive at the time. She was arguing with her father over the phone, felt that he was unsympathetic and felt overwhelmed with stressors.  She reported she took about 15 Klonopin. She reported she did not handle stress very well. She had received a DUI charge & ticket the night prior to the OD attempt. She related that she normally did not drink alcohol. It is a one time thing that got her into trouble. She reported symptoms of anxiety, anxiety attacks, legal problems that led to her hospitalization and in the past described mood swings and high levels of anxiety. Psychosocial Stressors included the following: family(they are supportive but feels bad when she lets them down), financial, educational, legal and drug and alcohol.   Progress in Program Toward Treatment Goals: Patient reported improvement compared to admission. She states she felt fine most of the time and better than she had been feeling in the past. Of note, her nausea and vomiting have resolved and has not had any recent vomiting. She feels the GI symptoms may have been associated to Abilify, as symptoms improved upon discontinuation of this medication. Therapeutic staff also agreed that she presented with brighter affect. Sharyl felt current medications were working well for her. She reported significant improvement in mood, anxiety and sleep after her medications were switched from Ability to Lamictal and Zyprexa. She participated in  groups although compliance with groups had decreased over the last few days prior to discharge and she was not present for day of discharge. In groups she learned coping strategies for stress, anxiety, impulsivity, depressive symptoms, family conflict, healthier communication strategies, problem solving, relaxation and strategies to create a healthier lifestyle. She reported that she used to smoke weed until she was prescribed Klonopin by Dr. Lillie ColumbiaNifong approximately two months prior to her hospitalization. Weed had been the only thing that helped her with anxiety. She discontinued Klonopin inpatient due to the high risk of taking this medication. Client's anxiety was significant in the PHP until she was tried on Lamictal and Zyprexa and then symptoms improved. Her symptoms have improved but she still needs to work on recognizing negative consequences that substance use had caused her life(2 DUI's, possession charge) and needs motivational strategies to help strengthen internal motivation to remain clean. In the last week, minimized and suppressed feelings and presented with less willingness to address issues in group so therapist assessment is that she needs to work on coping strategies to more effectively manage emotions and not suppress her feelings, address family conflict and life stressors. Family therapy was recommended by [name] was ambivalent at end of treatment did not agree to a family meeting.   Progress (rationale): Her discharge diagnosis was  Bipolar Spectrum Disorder, Cannabis Dependence. She presented with overall improved mood. Her current medications she reports were working well for her. She participated in groups although over the past week her engagement had decreased. She had learned coping strategies to better manage moods, stressors and family conflict. Patient states she has remained sober from cannabis and other drugs, and states  her boyfriend has also stopped using, making it easier for her  to abstain. She reports her relationship with her parents although still somewhat tense, has improved. Patient has returned to work and states she is doing well at work at this time. The patient denied SI/HI and voiced no AVH.   Discharge Plan: 1.Client was referred to [facility] but she refused this referral because she explains that it interferes with her work schedule. She was referred to Dr. Lolly Mustache on 11/23/13 at 3:00 PM and Marko Stai, LCSW on 11/26/13 at 9:00 AM as an alternative.  2.She is to continue on current medications of Lamictal 25 mg, Zyprexa 2.5 mg, Vistaril 25 mg 3 times a day as needed, Vistaril 50 mg at bedtime, Zofran 4 mg, desogestrel-ethinyl estradiol ( 0.15-0.02/0.01 MG (21/5) tablet, albuterol inhaler, and multivitamin.  3.Patient is instructed and cautioned to not engage in alcohol and or illegal drug use while on prescription medicines and due to negative consequences and abuse of substances in the past.   4.In the event of worsening symptoms, patient is instructed to call the crisis hotline, 911 and or go to the nearest ED for appropriate evaluation and treatment of symptoms.   5.Follow-up with her primary care provider for your other medical issues, concerns and or health care needs.   Bowman,[name] 11/11/2014

## 2014-11-12 ENCOUNTER — Telehealth (HOSPITAL_COMMUNITY): Payer: Self-pay | Admitting: Licensed Clinical Social Worker

## 2014-11-12 ENCOUNTER — Other Ambulatory Visit (HOSPITAL_COMMUNITY): Payer: Self-pay

## 2014-11-17 ENCOUNTER — Encounter (HOSPITAL_COMMUNITY): Payer: Self-pay | Admitting: Psychology

## 2014-11-17 NOTE — Progress Notes (Signed)
Kristopher GleeSarah E Ladd is a 19 y.o. female patient who is discharged from this provider as didn't return for counseling services following 06/29/14 appointment.  Outpatient Therapist Discharge Summary  Kristopher GleeSarah E Morr    12/10/1995   Admission Date: 06/15/14   Discharge Date:  09/29/14 Reason for Discharge:  Not active with cousneling Diagnosis:  Mood D/O NOS Comments:  [facility],  [facility] and referred for f/u w/ another outpt provider for f/u.  Malena PeerLeanne Yates          YATES,LEANNE, LPC

## 2014-11-19 ENCOUNTER — Other Ambulatory Visit (HOSPITAL_COMMUNITY): Payer: Self-pay | Admitting: Psychiatry
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
## 2014-11-23 ENCOUNTER — Ambulatory Visit (HOSPITAL_COMMUNITY): Payer: Self-pay | Admitting: Psychiatry

## 2014-11-25 ENCOUNTER — Telehealth (HOSPITAL_COMMUNITY): Payer: Self-pay | Admitting: *Deleted

## 2014-11-25 NOTE — Telephone Encounter (Signed)
LMOM for patient to call back to reschedule appointment, will be closed tomorrow due to weather.

## 2014-11-26 ENCOUNTER — Ambulatory Visit (HOSPITAL_COMMUNITY): Payer: Self-pay | Admitting: Clinical

## 2014-12-02 ENCOUNTER — Ambulatory Visit (HOSPITAL_COMMUNITY): Payer: Self-pay | Admitting: Psychiatry

## 2014-12-03 ENCOUNTER — Encounter (HOSPITAL_COMMUNITY): Payer: Self-pay | Admitting: *Deleted

## 2014-12-03 ENCOUNTER — Emergency Department (HOSPITAL_COMMUNITY)
Admission: EM | Admit: 2014-12-03 | Discharge: 2014-12-04 | Disposition: A | Discharge status: Other Acute Inpatient Hospital | Payer: BLUE CROSS/BLUE SHIELD | Attending: Emergency Medicine | Admitting: Emergency Medicine

## 2014-12-03 DIAGNOSIS — F3163 Bipolar disorder, current episode mixed, severe, without psychotic features: Secondary | ICD-10-CM | POA: Insufficient documentation

## 2014-12-03 DIAGNOSIS — Z872 Personal history of diseases of the skin and subcutaneous tissue: Secondary | ICD-10-CM | POA: Diagnosis not present

## 2014-12-03 DIAGNOSIS — F3160 Bipolar disorder, current episode mixed, unspecified: Secondary | ICD-10-CM | POA: Diagnosis present

## 2014-12-03 DIAGNOSIS — T424X2A Poisoning by benzodiazepines, intentional self-harm, initial encounter: Secondary | ICD-10-CM | POA: Insufficient documentation

## 2014-12-03 DIAGNOSIS — J45909 Unspecified asthma, uncomplicated: Secondary | ICD-10-CM | POA: Diagnosis not present

## 2014-12-03 DIAGNOSIS — Y9389 Activity, other specified: Secondary | ICD-10-CM | POA: Diagnosis not present

## 2014-12-03 DIAGNOSIS — X58XXXA Exposure to other specified factors, initial encounter: Secondary | ICD-10-CM | POA: Insufficient documentation

## 2014-12-03 DIAGNOSIS — Y998 Other external cause status: Secondary | ICD-10-CM | POA: Diagnosis not present

## 2014-12-03 DIAGNOSIS — Z79899 Other long term (current) drug therapy: Secondary | ICD-10-CM | POA: Insufficient documentation

## 2014-12-03 DIAGNOSIS — F1210 Cannabis abuse, uncomplicated: Secondary | ICD-10-CM | POA: Insufficient documentation

## 2014-12-03 DIAGNOSIS — Z793 Long term (current) use of hormonal contraceptives: Secondary | ICD-10-CM | POA: Insufficient documentation

## 2014-12-03 DIAGNOSIS — Z3202 Encounter for pregnancy test, result negative: Secondary | ICD-10-CM | POA: Insufficient documentation

## 2014-12-03 DIAGNOSIS — Z8742 Personal history of other diseases of the female genital tract: Secondary | ICD-10-CM | POA: Diagnosis not present

## 2014-12-03 DIAGNOSIS — Y9289 Other specified places as the place of occurrence of the external cause: Secondary | ICD-10-CM | POA: Insufficient documentation

## 2014-12-03 DIAGNOSIS — T424X1A Poisoning by benzodiazepines, accidental (unintentional), initial encounter: Secondary | ICD-10-CM | POA: Diagnosis present

## 2014-12-03 DIAGNOSIS — T1491XA Suicide attempt, initial encounter: Secondary | ICD-10-CM

## 2014-12-03 HISTORY — DX: Bipolar disorder, unspecified: F31.9

## 2014-12-03 LAB — ACETAMINOPHEN LEVEL: Acetaminophen (Tylenol), Serum: <10 ug/mL — ABNORMAL LOW (ref 10–30)

## 2014-12-03 LAB — RAPID URINE DRUG SCREEN, HOSP PERFORMED
Amphetamines: NOT DETECTED
BARBITURATES: NOT DETECTED
Benzodiazepines: POSITIVE — AB
COCAINE: NOT DETECTED
Opiates: NOT DETECTED
Tetrahydrocannabinol: POSITIVE — AB

## 2014-12-03 LAB — COMPREHENSIVE METABOLIC PANEL
ALT: 11 U/L (ref 0–35)
AST: 17 U/L (ref 0–37)
Albumin: 4.9 g/dL (ref 3.5–5.2)
Alkaline Phosphatase: 50 U/L (ref 39–117)
Anion gap: 8 (ref 5–15)
BUN: 8 mg/dL (ref 6–23)
CALCIUM: 9.4 mg/dL (ref 8.4–10.5)
CO2: 23 mmol/L (ref 19–32)
CREATININE: 0.73 mg/dL (ref 0.50–1.10)
Chloride: 110 mmol/L (ref 96–112)
Glucose, Bld: 95 mg/dL (ref 70–99)
POTASSIUM: 3.7 mmol/L (ref 3.5–5.1)
Sodium: 141 mmol/L (ref 135–145)
TOTAL PROTEIN: 7.9 g/dL (ref 6.0–8.3)
Total Bilirubin: 0.9 mg/dL (ref 0.3–1.2)

## 2014-12-03 LAB — CBC
HEMATOCRIT: 45.2 % (ref 36.0–46.0)
Hemoglobin: 15.5 g/dL — ABNORMAL HIGH (ref 12.0–15.0)
MCH: 31.1 pg (ref 26.0–34.0)
MCHC: 34.3 g/dL (ref 30.0–36.0)
MCV: 90.6 fL (ref 78.0–100.0)
Platelets: 314 10*3/uL (ref 150–400)
RBC: 4.99 MIL/uL (ref 3.87–5.11)
RDW: 12.7 % (ref 11.5–15.5)
WBC: 8.9 10*3/uL (ref 4.0–10.5)

## 2014-12-03 LAB — POC URINE PREG, ED: PREG TEST UR: NEGATIVE

## 2014-12-03 LAB — SALICYLATE LEVEL

## 2014-12-03 LAB — CBG MONITORING, ED: Glucose-Capillary: 80 mg/dL (ref 70–99)

## 2014-12-03 LAB — ETHANOL: Alcohol, Ethyl (B): <5 mg/dL (ref 0–9)

## 2014-12-03 LAB — LITHIUM LEVEL

## 2014-12-03 MED ORDER — DIPHENHYDRAMINE HCL 50 MG/ML IJ SOLN
INTRAMUSCULAR | Status: AC
  Filled 2014-12-03: qty 1

## 2014-12-03 MED ORDER — LORAZEPAM 2 MG/ML IJ SOLN: 2.0000 mg | Freq: Once | INTRAMUSCULAR | Status: DC

## 2014-12-03 MED ORDER — LORAZEPAM 2 MG/ML IJ SOLN
INTRAMUSCULAR | Status: AC
  Filled 2014-12-03: qty 1

## 2014-12-03 MED ORDER — DIPHENHYDRAMINE HCL 50 MG/ML IJ SOLN
50.0000 mg | Freq: Once | INTRAMUSCULAR | Status: AC
  Administered 2014-12-03: 50 mg via INTRAMUSCULAR

## 2014-12-03 NOTE — ED Notes (Signed)
[id number] Expected date:  Expected time:  Means of arrival:  Comments: RES A

## 2014-12-03 NOTE — BH Assessment (Signed)
Writer informed TTS Brandi of the consult.  
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
## 2014-12-03 NOTE — Progress Notes (Addendum)
CSW faxed patient referral to [address], [address] [facility], [address], [address], [place].  Will continue to seek placement.  Melbourne Abtsatia Kellan Raffield, LCSWA Disposition staff 12/03/2014 6:47 PM
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
## 2014-12-03 NOTE — ED Notes (Signed)
[facility] Pt. Alert and oriented, crying, asking for help in restraints started prior to 1900.  Pt. Instructed to make  me aware of problems or concerns.Will continue to monitor for safety via security cameras and Q 15 minute checks. Tech at bedside.

## 2014-12-03 NOTE — ED Notes (Signed)
Pt reports she "wants to die, you are going to give me off to the next dr who isn't going to do anything, I'm done, I'm done, life sucks and I want to die." Pt will not discuss specifics of the current situation. Just tells RN to reference previous records and see the previous times she "done this." Denies HI. Reports she took 10 xanax bars 30-1945min ago in a suicide attempt.

## 2014-12-03 NOTE — ED Notes (Signed)
Restraints removed after medication and therapeutic conversation.

## 2014-12-04 DIAGNOSIS — T424X1A Poisoning by benzodiazepines, accidental (unintentional), initial encounter: Secondary | ICD-10-CM | POA: Diagnosis present

## 2014-12-04 DIAGNOSIS — T1491XA Suicide attempt, initial encounter: Secondary | ICD-10-CM | POA: Diagnosis present

## 2014-12-04 MED ORDER — IBUPROFEN 200 MG PO TABS
600.0000 mg | ORAL_TABLET | Freq: Four times a day (QID) | ORAL | Status: DC | PRN
Start: 2014-12-04 — End: 2014-12-04
  Administered 2014-12-04: 600 mg via ORAL
  Filled 2014-12-04: qty 3

## 2014-12-04 MED ORDER — DIPHENHYDRAMINE HCL 25 MG PO CAPS
25.0000 mg | ORAL_CAPSULE | Freq: Once | ORAL | Status: AC
  Administered 2014-12-04: 25 mg via ORAL
  Filled 2014-12-04: qty 1

## 2014-12-04 NOTE — ED Notes (Signed)
Friend to bring pt's clothes in

## 2014-12-04 NOTE — ED Notes (Addendum)
Talking w/ friend at bedside. [facility] given to friend w/ patients permission.  Pt then became tearful repeating that nothing helps and that she has been trying to get help for the past year and nobody will help.  Pt also stated that I needed to give them my phone number so they "can call you when I die." Support given, friend at bedside. Pt declined PO fluid/food. NAD

## 2014-12-04 NOTE — BH Assessment (Signed)
Sp[facility] with [facility] who reported that pt has been accepted to the services of Dr. Shawnie DapperLopez. Pt [place] be transported [place] 10 [address] Nurse to nurse report # [phone]. Dr. Norlene Campbelltter has been informed of the [facility].

## 2014-12-04 NOTE — ED Notes (Signed)
Friend into see 

## 2014-12-04 NOTE — ED Notes (Signed)
Pt's father into see 

## 2014-12-04 NOTE — ED Notes (Addendum)
Sheriff is here to transport.  Pt's dad at bedside, [facility] to dad  w/ patients permission.  Pt ambulatory w/o difficulty, but resistant to going.  Pt redirected and ambulatory to the sheriff's car, tearful.  Support given.  On arrival to the car pt reported that I will not see her again because she would succeed next time.  MAR report, IVC papers, EKG, face sheet, EMTELA, transport report sent w/ sheriff.

## 2014-12-04 NOTE — ED Notes (Signed)
Pt and friend are aware that the sheriff will be here shortly to transport. Pt requesting to talk w/ her mother.

## 2014-12-04 NOTE — ED Notes (Signed)
Pt's clothes/boots brought by friend, searched by security.  Will sent w/ patient

## 2014-12-04 NOTE — ED Notes (Signed)
Up on the phone to talk to her mother

## 2014-12-04 NOTE — ED Notes (Signed)
[facility] updated that patient has transported and medication given prior to transport

## 2014-12-04 NOTE — ED Notes (Addendum)
Sheriff will be here in approx 45 mins to transport.

## 2014-12-28 ENCOUNTER — Telehealth (HOSPITAL_COMMUNITY): Payer: Self-pay | Admitting: Licensed Clinical Social Worker

## 2014-12-30 ENCOUNTER — Telehealth (HOSPITAL_COMMUNITY): Payer: Self-pay | Admitting: Licensed Clinical Social Worker

## 2015-01-19 ENCOUNTER — Telehealth (HOSPITAL_COMMUNITY): Payer: Self-pay | Admitting: Licensed Clinical Social Worker

## 2015-01-20 ENCOUNTER — Encounter (HOSPITAL_COMMUNITY): Payer: Self-pay | Admitting: Behavioral Health

## 2015-07-26 ENCOUNTER — Other Ambulatory Visit: Payer: Self-pay | Admitting: Nurse Practitioner

## 2015-07-26 NOTE — Telephone Encounter (Signed)
Medication refill request: AZURETTE Last AEX: 02/23/2014 with PG  Next AEX: Not scheduled Last MMG (if hormonal medication request):  Refill authorized: please advise   Patient was given a call to schedule AEX, unable to leave message no voicemail.

## 2015-09-28 ENCOUNTER — Encounter (HOSPITAL_COMMUNITY): Payer: Self-pay | Admitting: Licensed Clinical Social Worker

## 2016-10-31 IMAGING — US US ABDOMEN COMPLETE
1 series · 14 of 25 positions shown · non-contrast
Comparison: CT abdomen and pelvis 10/25/2011

CLINICAL DATA: RIGHT upper quadrant pain, nausea, vomiting

EXAM:
ULTRASOUND ABDOMEN COMPLETE

[Series 1: us abdomen complete · 0.30mm/px · 14 of 76 slices shown]
[im 1/76]
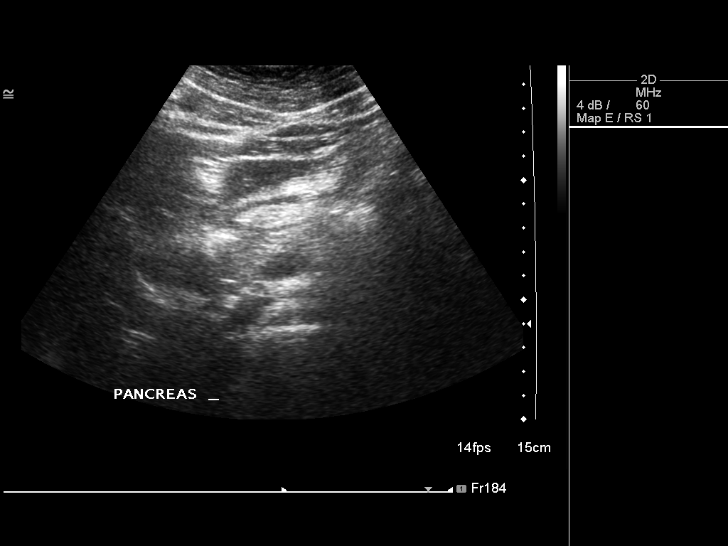
[im 7/76]
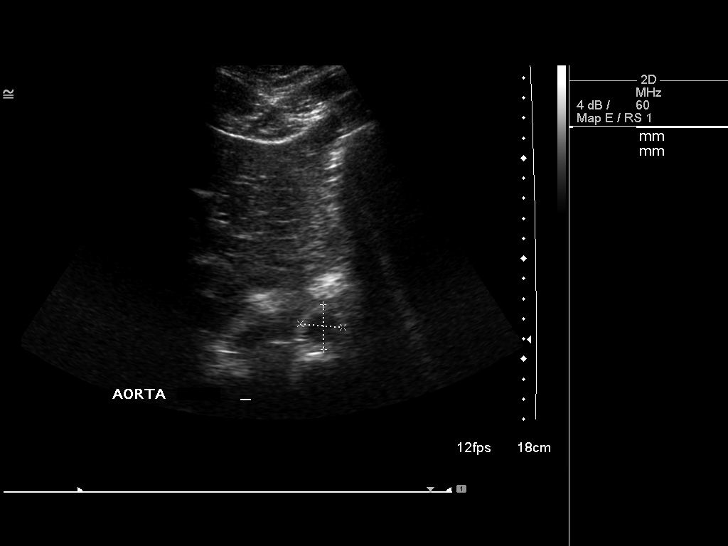
[im 13/76]
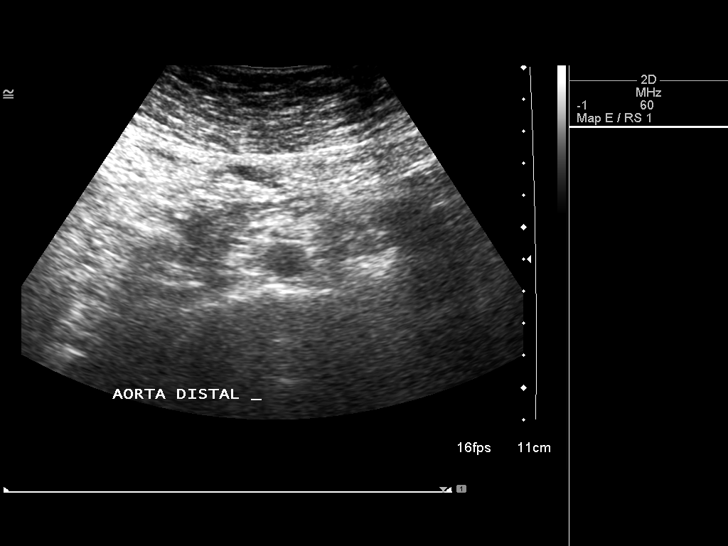
[im 19/76]
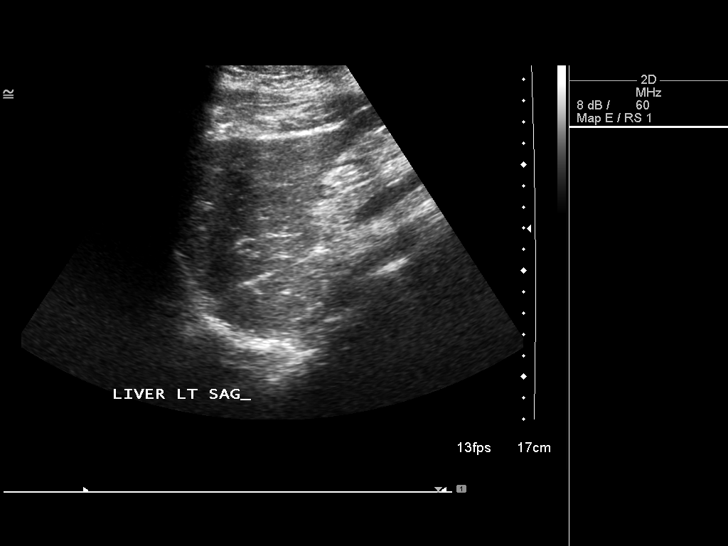
[im 26/76]
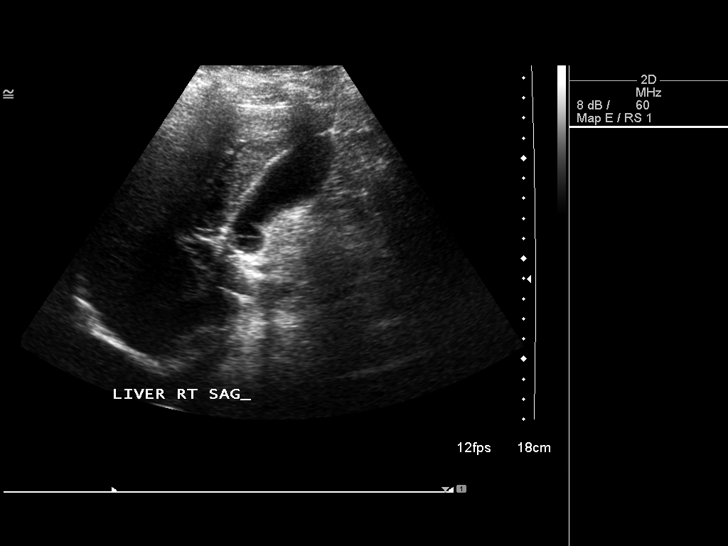
[im 29/76]
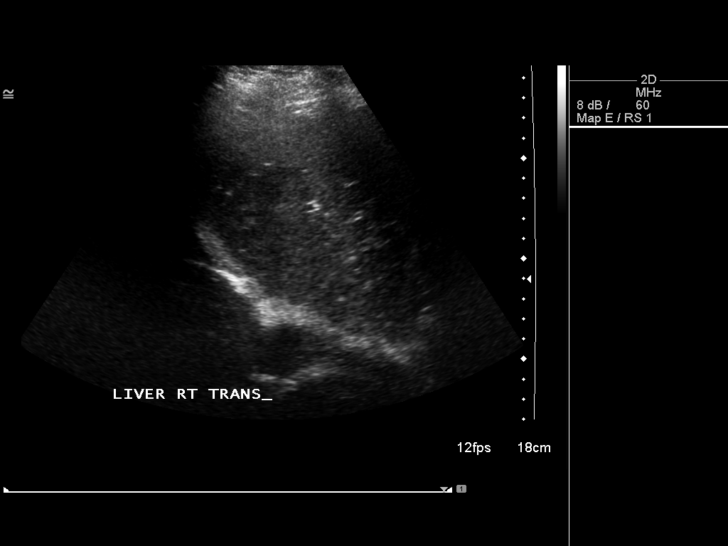
[im 35/76]
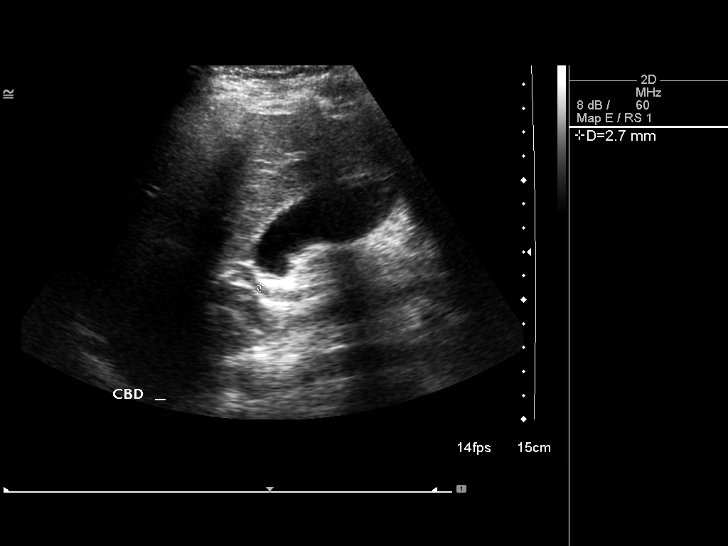
[im 41/76]
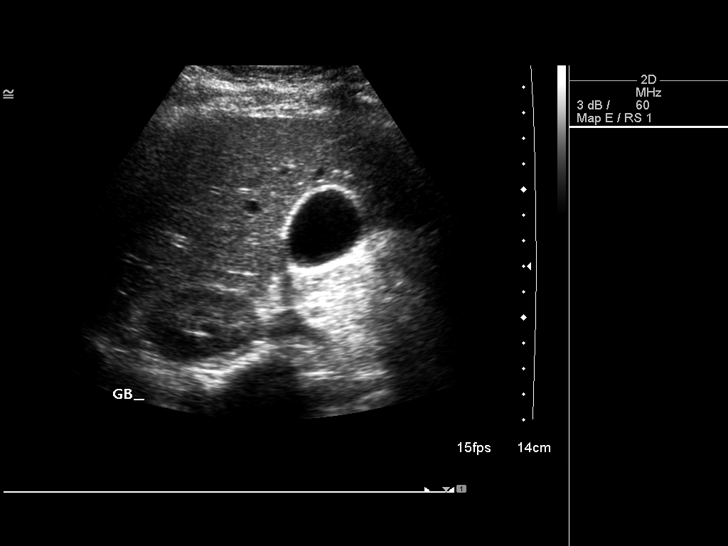
[im 47/76]
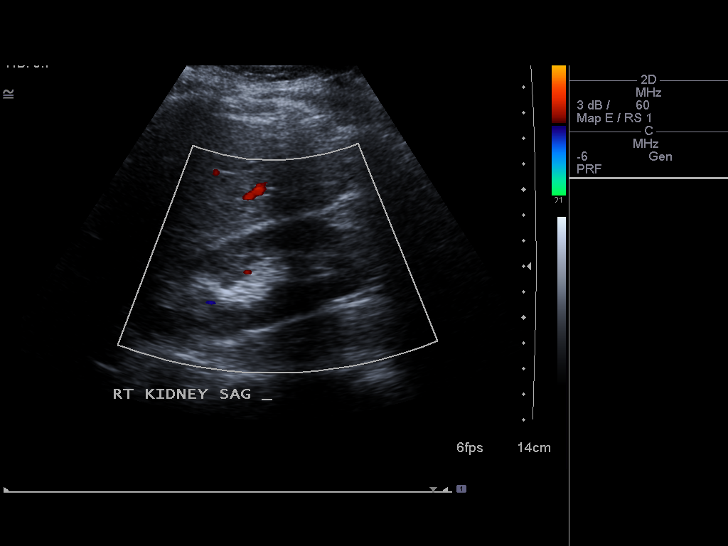
[im 51/76]
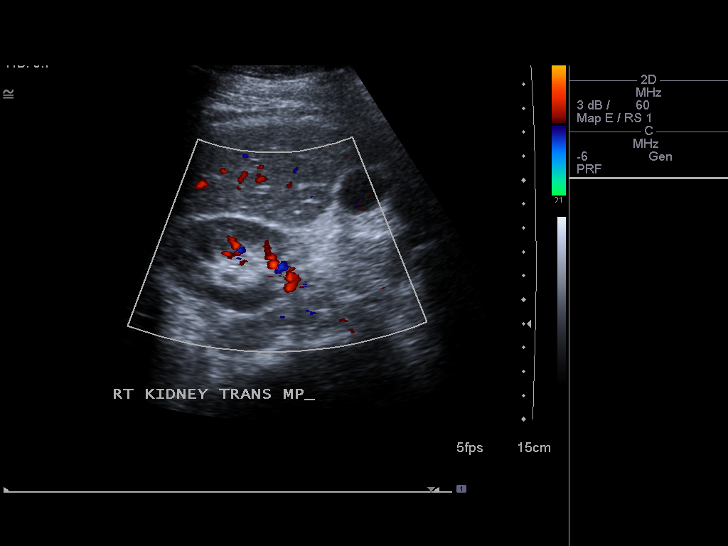
[im 57/76]
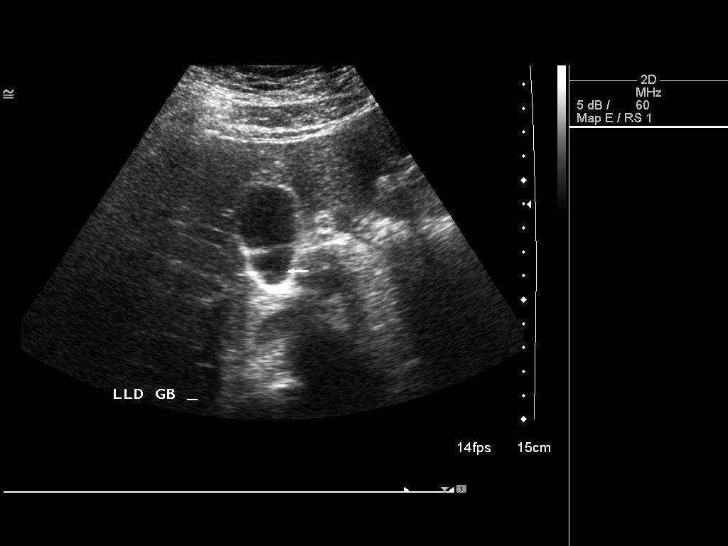
[im 63/76]
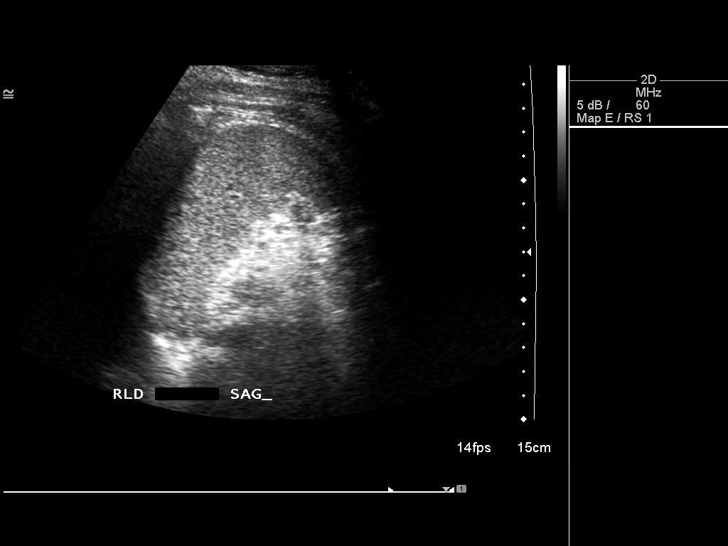
[im 69/76]
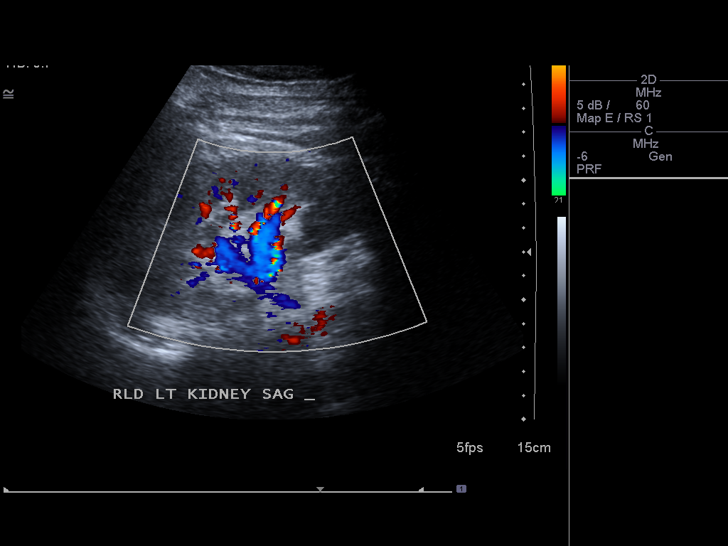
[im 76/76]
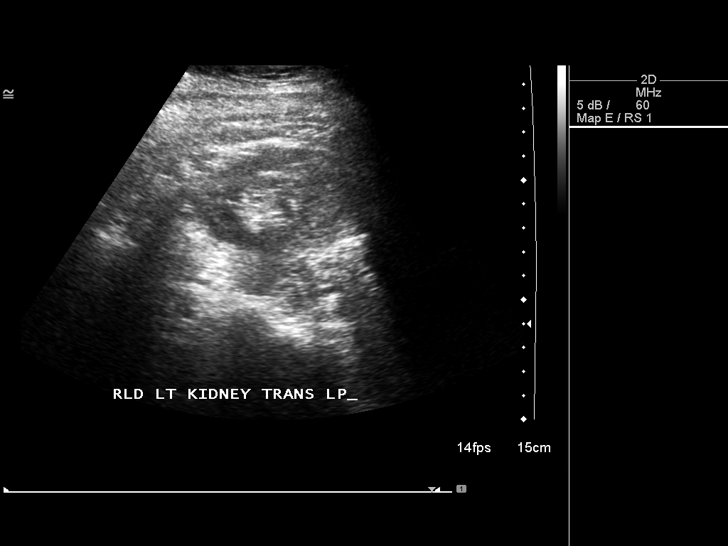

[14 of 25 positions shown; findings below may reference images not displayed]

FINDINGS: Gallbladder: Minimal dependent sludge.

Normally distended without stones or wall thickening.

No pericholecystic fluid or sonographic Murphy sign.

Common bile duct: Diameter: 3 mm diameter, normal

Liver: Normal appearance

IVC: Normal appearance

Pancreas: Normal appearance

Spleen: Normal appearance, 7.0 length

Right Kidney: Length: 9.7 cm. Normal morphology without mass or
hydronephrosis.

Left Kidney: Length: 11.1 cm. Normal morphology without mass or
hydronephrosis.

Abdominal aorta: No aneurysm visualized.

Other findings: No free-fluid
IMPRESSION: Minimal dependent sludge within gallbladder.

Otherwise normal exam.

When compared to the previous exam, spleen has decreased in size

## 2024-08-27 ENCOUNTER — Other Ambulatory Visit (HOSPITAL_BASED_OUTPATIENT_CLINIC_OR_DEPARTMENT_OTHER): Payer: Self-pay
